# Patient Record
Sex: Female | Born: 1945
Health system: Southern US, Community
[De-identification: ages and names within clinical notes are randomized; demographics above are authoritative.]

## PROBLEM LIST (undated history)

## (undated) DIAGNOSIS — F419 Anxiety disorder, unspecified: Secondary | ICD-10-CM

## (undated) DIAGNOSIS — I10 Essential (primary) hypertension: Secondary | ICD-10-CM

## (undated) DIAGNOSIS — F329 Major depressive disorder, single episode, unspecified: Secondary | ICD-10-CM

## (undated) DIAGNOSIS — F32A Depression, unspecified: Secondary | ICD-10-CM

## (undated) HISTORY — PX: CHOLECYSTECTOMY: SHX55

## (undated) HISTORY — DX: Essential (primary) hypertension: I10

## (undated) HISTORY — PX: COLONOSCOPY: SHX174

## (undated) HISTORY — PX: OTHER SURGICAL HISTORY: SHX169

## (undated) HISTORY — PX: TONSILLECTOMY: SUR1361

---
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
## 1978-01-09 HISTORY — PX: BREAST RECONSTRUCTION: SHX9

## 1992-01-10 HISTORY — PX: BREAST RECONSTRUCTION: SHX9

## 2001-05-16 ENCOUNTER — Other Ambulatory Visit: Admission: RE | Admit: 2001-05-16 | Discharge: 2001-05-16 | Payer: Self-pay | Admitting: Internal Medicine

## 2002-05-21 ENCOUNTER — Other Ambulatory Visit: Admission: RE | Admit: 2002-05-21 | Discharge: 2002-05-21 | Payer: Self-pay | Admitting: Internal Medicine

## 2003-11-17 ENCOUNTER — Ambulatory Visit (HOSPITAL_COMMUNITY): Admission: RE | Admit: 2003-11-17 | Discharge: 2003-11-17 | Payer: Self-pay | Admitting: Gastroenterology

## 2004-12-27 ENCOUNTER — Other Ambulatory Visit: Admission: RE | Admit: 2004-12-27 | Discharge: 2004-12-27 | Payer: Self-pay | Admitting: Internal Medicine

## 2007-01-25 ENCOUNTER — Other Ambulatory Visit: Admission: RE | Admit: 2007-01-25 | Discharge: 2007-01-25 | Payer: Self-pay | Admitting: Family Medicine

## 2009-08-19 ENCOUNTER — Encounter: Admission: RE | Admit: 2009-08-19 | Discharge: 2009-08-19 | Payer: Self-pay | Admitting: Family Medicine

## 2010-03-10 ENCOUNTER — Other Ambulatory Visit: Payer: Self-pay | Admitting: Family Medicine

## 2010-03-10 ENCOUNTER — Other Ambulatory Visit (HOSPITAL_COMMUNITY)
Admission: RE | Admit: 2010-03-10 | Discharge: 2010-03-10 | Disposition: A | Discharge status: Home / Self Care | Payer: BC Managed Care – PPO | Source: Ambulatory Visit | Attending: Family Medicine | Admitting: Family Medicine

## 2010-03-10 DIAGNOSIS — Z0000 Encounter for general adult medical examination without abnormal findings: Secondary | ICD-10-CM | POA: Insufficient documentation

## 2010-05-27 NOTE — Op Note (Signed)
NAME:  TOMASITA, BEEVERS NO.:  [id number]   MEDICAL RECORD NO.:  [id number]          PATIENT TYPE:  AMB   LOCATION:  ENDO                         FACILITY:  [facility]   PHYSICIAN:  Danise Edge, M.D.   DATE OF BIRTH:  09/30/1945   DATE OF PROCEDURE:  11/17/2003  DATE OF DISCHARGE:                                 OPERATIVE REPORT   PROCEDURE:  Screening colonoscopy.   REFERRED BY:  Sharlet Salina, M.D.   INDICATIONS FOR PROCEDURE:  Ms. Ila Landowski is a 65 year old female born  1945/02/15.  Ms. Pavelko is scheduled to undergo her first screening  colonoscopy with polypectomy to prevent colon cancer.   ENDOSCOPIST:  Danise Edge, M.D.   PREMEDICATION:  Versed 7.5 mg, Demerol 70 mg.   DESCRIPTION OF PROCEDURE:  After obtaining informed consent, Ms. Fate was  placed in the left lateral decubitus position. I administered intravenous  Demerol and intravenous Versed to achieve conscious sedation for the  procedure. The patient's blood pressure, oxygen saturation and cardiac  rhythm were monitored throughout the procedure and documented in the medical  record.   Anal inspection and digital rectal exam were normal. The Olympus adjustable  pediatric colonoscope was introduced into the rectum and advanced to the  cecum. Colonic preparation for the exam today was excellent.   RECTUM:  Normal.   SIGMOID COLON AND DESCENDING COLON:  Scattered left colonic diverticula.   SPLENIC FLEXURE:  Normal.   TRANSVERSE COLON:  Normal.   HEPATIC FLEXURE:  Normal.   ASCENDING COLON:  Normal.   CECUM AND ILEOCECAL VALVE:  Normal.   ASSESSMENT:  Normal screening proctocolonoscopy to the cecum.  No endoscopic  evidence for the presence of colorectal neoplasia.      MJ/MEDQ  D:  11/17/2003  T:  11/17/2003  Job:  [id number]   cc:   Sharlet Salina, M.D.  [address]  [place]  [place] [address]  Fax: [phone]

## 2010-07-15 ENCOUNTER — Other Ambulatory Visit: Payer: Self-pay | Admitting: Family Medicine

## 2010-07-15 DIAGNOSIS — R1013 Epigastric pain: Secondary | ICD-10-CM

## 2010-07-18 ENCOUNTER — Ambulatory Visit
Admission: RE | Admit: 2010-07-18 | Discharge: 2010-07-18 | Disposition: A | Discharge status: Home / Self Care | Payer: BC Managed Care – PPO | Source: Ambulatory Visit | Attending: Family Medicine | Admitting: Family Medicine

## 2010-07-18 DIAGNOSIS — R1013 Epigastric pain: Secondary | ICD-10-CM

## 2010-07-20 ENCOUNTER — Encounter (INDEPENDENT_AMBULATORY_CARE_PROVIDER_SITE_OTHER): Payer: Self-pay | Admitting: Surgery

## 2010-07-20 ENCOUNTER — Ambulatory Visit (INDEPENDENT_AMBULATORY_CARE_PROVIDER_SITE_OTHER): Payer: BC Managed Care – PPO | Admitting: Surgery

## 2010-07-20 VITALS — BP 124/82 | HR 70 | Temp 97.2°F | Resp 16 | Ht 60.25 in | Wt 125.4 lb

## 2010-07-20 DIAGNOSIS — K8010 Calculus of gallbladder with chronic cholecystitis without obstruction: Secondary | ICD-10-CM

## 2010-07-20 NOTE — Progress Notes (Signed)
Subjective:     Patient ID: Melody [name], female   DOB: 1945/07/12, 65 y.o.   MRN: [id number]    BP 124/82  Pulse 70  Temp(Src) 97.2 F (36.2 C) (Temporal)  Resp 16  Ht 5' 0.25" (1.53 m)  Wt 125 lb 6.4 oz (56.881 kg)  BMI 24.29 kg/m2    HPI The patient presents today at the request of Dr. Cliffton Asters. She has a one-week history of epigastric abdominal pain. Pain is mild to moderate in intensity and now is more severe last week. There is no radiation. Now he feels like a dull ache. He is better today than it was last week. There is no associated nausea or vomiting. Ultrasound shows gallstones. Past Medical History  Diagnosis Date  . Hypertension    Past Surgical History  Procedure Date  . Cesarean section   . Tonsilectomy, adenoidectomy, bilateral myringotomy and tubes 1955  . Breast reconstruction 1980    breast implant placement  . Breast reconstruction 1994    breast implant removal   Allergies  Allergen Reactions  . Ambien    History   Social History  . Marital Status: Divorced    Spouse Name: N/A    Number of Children: N/A  . Years of Education: N/A   Occupational History  . Not on file.   Social History Main Topics  . Smoking status: Never Smoker   . Smokeless tobacco: Never Used  . Alcohol Use: No  . Drug Use: No  . Sexually Active:    Other Topics Concern  . Not on file   Social History Narrative  . No narrative on file     Review of Systems  Constitutional: Negative.   HENT: Negative.   Eyes: Negative.   Respiratory: Negative.   Cardiovascular: Negative.   Gastrointestinal: Positive for abdominal pain.  Genitourinary: Negative.   Musculoskeletal: Negative.   Skin: Negative.   Neurological: Negative.   Hematological: Negative.   Psychiatric/Behavioral: Negative.        Objective:   Physical Exam  Constitutional: She is oriented to person, place, and time. She appears well-developed and well-nourished.  HENT:  Head: Normocephalic and  atraumatic.  Eyes: Conjunctivae and EOM are normal. Pupils are equal, round, and reactive to light.  Neck: Normal range of motion. Neck supple.  Cardiovascular: Normal rate, regular rhythm and normal heart sounds.  Exam reveals no gallop.   No murmur heard. Pulmonary/Chest: Effort normal and breath sounds normal.  Abdominal: Soft. Bowel sounds are normal. There is tenderness.       Mild RUQ tenderness  Musculoskeletal: Normal range of motion.  Neurological: She is alert and oriented to person, place, and time. She has normal reflexes.  Skin: Skin is warm and dry.  Psychiatric: She has a normal mood and affect. Judgment and thought content normal.       Assessment:     Symptomatic cholelithiasis with probable mild acute cholecystitis resolving    Plan:     I discussed treatment options with the patient today. I discussed medical and surgical options as well today. I feel laparoscopic cholecystectomy with cholangiogram would be the best treatment option for her. I discussed the procedure as well as long-term expectations and complications.The procedure has been discussed with the patient. Operative and non operative treatments have been discussed. Risks of surgery include bleeding, iInfection,  Common bile duct injury,  Injury to the stomach,liver, colon,small intestine, abdominal wall,  Diaphragm,  Major blood vessels,  And the need  for an open procedure.  Other risks include worsening of medical problems,  DVT and pulmonary embolism, and cardiovascular events.   Medical options have also been discussed. The patient has been informed of long term expectations of surgery and non surgical options,  The patient agrees to proceed.

## 2010-07-20 NOTE — Patient Instructions (Signed)
Avoid fatty foods.  You will be scheduled for surgery.

## 2010-08-17 ENCOUNTER — Other Ambulatory Visit (INDEPENDENT_AMBULATORY_CARE_PROVIDER_SITE_OTHER): Payer: Self-pay | Admitting: Surgery

## 2010-08-17 ENCOUNTER — Ambulatory Visit (HOSPITAL_COMMUNITY)
Admission: RE | Admit: 2010-08-17 | Discharge: 2010-08-17 | Disposition: A | Discharge status: Home / Self Care | Payer: BC Managed Care – PPO | Source: Ambulatory Visit | Attending: Surgery | Admitting: Surgery

## 2010-08-17 ENCOUNTER — Encounter (HOSPITAL_COMMUNITY): Payer: BC Managed Care – PPO

## 2010-08-17 DIAGNOSIS — Z01818 Encounter for other preprocedural examination: Secondary | ICD-10-CM | POA: Insufficient documentation

## 2010-08-17 DIAGNOSIS — Z01812 Encounter for preprocedural laboratory examination: Secondary | ICD-10-CM | POA: Insufficient documentation

## 2010-08-17 DIAGNOSIS — Z01810 Encounter for preprocedural cardiovascular examination: Secondary | ICD-10-CM | POA: Insufficient documentation

## 2010-08-17 LAB — COMPREHENSIVE METABOLIC PANEL WITH GFR
ALT: 16 U/L (ref 0–35)
AST: 21 U/L (ref 0–37)
Albumin: 3.8 g/dL (ref 3.5–5.2)
Alkaline Phosphatase: 62 U/L (ref 39–117)
BUN: 21 mg/dL (ref 6–23)
CO2: 32 meq/L (ref 19–32)
Calcium: 10 mg/dL (ref 8.4–10.5)
Chloride: 100 meq/L (ref 96–112)
Creatinine, Ser: 0.65 mg/dL (ref 0.50–1.10)
GFR calc Af Amer: >60 mL/min (ref >60)
GFR calc non Af Amer: >60 mL/min (ref >60)
Glucose, Bld: 93 mg/dL (ref 70–99)
Potassium: 4.7 meq/L (ref 3.5–5.1)
Sodium: 137 meq/L (ref 135–145)
Total Bilirubin: 0.3 mg/dL (ref 0.3–1.2)
Total Protein: 7.6 g/dL (ref 6.0–8.3)

## 2010-08-17 LAB — DIFFERENTIAL
Basophils Absolute: 0.1 10*3/uL (ref 0.0–0.1)
Basophils Absolute: 0.1 10*3/uL (ref 0.0–0.1)
Basophils Relative: 1 % (ref 0–1)
Basophils Relative: 1 % (ref 0–1)
Eosinophils Absolute: 0.2 10*3/uL (ref 0.0–0.7)
Eosinophils Absolute: 0.2 10*3/uL (ref 0.0–0.7)
Eosinophils Relative: 4 % (ref 0–5)
Eosinophils Relative: 4 % (ref 0–5)
Lymphocytes Relative: 41 % (ref 12–46)
Lymphocytes Relative: 41 % (ref 12–46)
Lymphs Abs: 2.6 10*3/uL (ref 0.7–4.0)
Lymphs Abs: 2.6 10*3/uL (ref 0.7–4.0)
Monocytes Absolute: 0.6 10*3/uL (ref 0.1–1.0)
Monocytes Absolute: 0.6 10*3/uL (ref 0.1–1.0)
Monocytes Relative: 9 % (ref 3–12)
Monocytes Relative: 9 % (ref 3–12)
Neutro Abs: 2.8 10*3/uL (ref 1.7–7.7)
Neutro Abs: 2.8 10*3/uL (ref 1.7–7.7)
Neutrophils Relative %: 45 % (ref 43–77)
Neutrophils Relative %: 45 % (ref 43–77)

## 2010-08-17 LAB — SURGICAL PCR SCREEN
MRSA, PCR: NEGATIVE
Staphylococcus aureus: NEGATIVE

## 2010-08-17 LAB — CBC
HCT: 40.2 % (ref 36.0–46.0)
Hemoglobin: 13.4 g/dL (ref 12.0–15.0)
MCH: 30 pg (ref 26.0–34.0)
MCHC: 33.3 g/dL (ref 30.0–36.0)
MCV: 89.9 fL (ref 78.0–100.0)
Platelets: 167 10*3/uL (ref 150–400)
RBC: 4.47 MIL/uL (ref 3.87–5.11)
RDW: 13.2 % (ref 11.5–15.5)
WBC: 6.3 10*3/uL (ref 4.0–10.5)

## 2010-08-29 ENCOUNTER — Other Ambulatory Visit (INDEPENDENT_AMBULATORY_CARE_PROVIDER_SITE_OTHER): Payer: Self-pay | Admitting: Surgery

## 2010-08-29 ENCOUNTER — Ambulatory Visit (HOSPITAL_COMMUNITY): Payer: BC Managed Care – PPO

## 2010-08-29 ENCOUNTER — Ambulatory Visit (HOSPITAL_COMMUNITY)
Admission: RE | Admit: 2010-08-29 | Discharge: 2010-08-29 | Disposition: A | Discharge status: Home / Self Care | Payer: BC Managed Care – PPO | Source: Ambulatory Visit | Attending: Surgery | Admitting: Surgery

## 2010-08-29 DIAGNOSIS — Z01818 Encounter for other preprocedural examination: Secondary | ICD-10-CM | POA: Insufficient documentation

## 2010-08-29 DIAGNOSIS — Z01810 Encounter for preprocedural cardiovascular examination: Secondary | ICD-10-CM | POA: Insufficient documentation

## 2010-08-29 DIAGNOSIS — K8010 Calculus of gallbladder with chronic cholecystitis without obstruction: Secondary | ICD-10-CM

## 2010-08-29 DIAGNOSIS — Z01812 Encounter for preprocedural laboratory examination: Secondary | ICD-10-CM | POA: Insufficient documentation

## 2010-09-05 NOTE — Op Note (Signed)
[name], [name] NO.:  [id number]  MEDICAL RECORD NO.:  [id number]  LOCATION:  DAYL                         FACILITY:  [facility]  PHYSICIAN:  Melody [name], M.D.DATE OF BIRTH:  03/16/1945  DATE OF PROCEDURE:  08/29/2010 DATE OF DISCHARGE:  08/29/2010                              OPERATIVE REPORT   PREOPERATIVE DIAGNOSIS:  Symptomatic cholelithiasis with chronic cholecystitis.  POSTOPERATIVE DIAGNOSIS:  Symptomatic cholelithiasis with chronic cholecystitis.  PROCEDURE PERFORMED:  Laparoscopic cholecystectomy with intraoperative cholangiogram.  SURGEON:  Melody Mckellips A. Burris Matherne, MD  ANESTHESIA:  General endotracheal anesthesia, with 0.25% Sensorcaine local with epinephrine.  ESTIMATED BLOOD LOSS:  Minimal.  SPECIMEN:  Gallbladder, gallstones to pathology.  DRAINS:  None.  INDICATIONS FOR PROCEDURE:  The patient is a 65 year old female with symptomatic cholelithiasis.  She has had multiple attacks and probably some acute on chronic cholecystitis.  We discussed options of medical and surgical treatment of this condition.  We discussed the pathophysiology of the condition.  Risk of operative and nonoperative management were discussed.  The benefits of surgery and risks of surgery were discussed.  Risk of bleeding, infection, bile duct injury, injury to neighboring structures, injury to the colon, small-bowel, liver, stomach, duodenum, and abdominal wall and diaphragm were discussed. Other risks were discussed, which are outlined in my history and physical.  She understood the above and wished to proceed with surgical management.  DESCRIPTION OF PROCEDURE:  The patient was brought to the operating room.  After lying prone on the operating table, general anesthesia was initiated and time-out was done.  After sterile prep and drape of the abdomen, 1 cm infraumbilical incision was made.  Dissection was carried down the fascia and the fascia was opened  without any difficulty.  A pursestring suture of 0 Vicryl was placed and a 12-mm Hasson cannula was placed under direct vision.  Pneumoperitoneum was created to 15 mmHg of CO2 and laparoscope was placed.  On inspection, there was no evidence of injury to the bowel upon insertion at this port site.  An 11-mm subxiphoid port was placed and two 5-mm ports were placed in the right upper quadrant.  We then identified the gallbladder.  It was grabbed by its dome and retracted to the patient's right shoulder.  She had some chronic changes of cholecystitis but no acute on chronic today.  Second grasper was used to grab the infundibulum, pulled to the patient's right lower quadrant.  This opened his triangle of Calot.  We were able then to dissect out the cystic duct.  The critical view was achieved.  I then placed clips on the cystic duct, on the gallbladder side of this.  Small incision was made through a separate stab incision and Cook cholangiogram catheter was placed into the cystic duct.  Intraoperative cholangiogram was then performed using one-half strength Hypaque dye. There was free flow of contrast in a tortuous, long cystic duct stump, into the common bile duct and duodenum.  There was free flow of contrast into the common hepatic duct and right and left hepatic ducts.  No signs of stricture, extravasation or stones.  We then removed the catheter. We placed three clips  across the cystic duct stump and divided it.  The cystic artery was identified, dissected out, and controlled between clips and divided.  Cautery was used to dissect the gallbladder from the gallbladder fossa without difficulty.  This was then placed in an EndoCatch bag.  The gallbladder bed was examined, found be hemostatic with no evidence of bleeding or bile leakage.  We then extracted the gallbladder with the EndoCatch bag through the umbilicus and passed it off the field.  This fascial wound was closed with 0 Vicryl  under direct vision.  We then reinspected the gallbladder bed, saw no signs of bleeding or bile leakage and suctioned out any excess irrigation.  Four quadrant laparoscopy was performed which showed no evidence of injury to the bowel or any problem with the closure of the umbilicus.  We then removed our ports allowing the CO2 to escape.  I placed a single stitch in the fascia at the subxiphoid trocar site. 4-0 Monocryl was used to close the skin in a subcuticular fashion.  Dermabond was applied.  All final counts of sponge, needles and instruments were found to be correct at this portion of the case.  The patient was awoke, extubated, taken to recovery in satisfactory condition.     Melody [name], M.D.     TAC/MEDQ  D:  08/29/2010  T:  08/29/2010  Job:  [id number]  cc:   Melody [name], M.D. Fax: [phone]  Electronically Signed by Melody [name] M.D. on 09/05/2010 10:39:27 AM

## 2010-09-22 ENCOUNTER — Encounter (INDEPENDENT_AMBULATORY_CARE_PROVIDER_SITE_OTHER): Payer: BC Managed Care – PPO | Admitting: Surgery

## 2010-10-13 ENCOUNTER — Emergency Department (HOSPITAL_COMMUNITY): Payer: BC Managed Care – PPO

## 2010-10-13 ENCOUNTER — Emergency Department (HOSPITAL_COMMUNITY)
Admission: EM | Admit: 2010-10-13 | Discharge: 2010-10-13 | Disposition: A | Discharge status: Nursing Home (Includes ICF) | Payer: BC Managed Care – PPO | Source: Home / Self Care | Attending: Emergency Medicine | Admitting: Emergency Medicine

## 2010-10-13 ENCOUNTER — Observation Stay (HOSPITAL_COMMUNITY)
Admission: EM | Admit: 2010-10-13 | Discharge: 2010-10-15 | DRG: 142 | Disposition: A | Discharge status: Home / Self Care | Payer: BC Managed Care – PPO | Attending: Internal Medicine | Admitting: Internal Medicine

## 2010-10-13 ENCOUNTER — Encounter (HOSPITAL_COMMUNITY): Payer: Self-pay

## 2010-10-13 DIAGNOSIS — Z23 Encounter for immunization: Secondary | ICD-10-CM | POA: Insufficient documentation

## 2010-10-13 DIAGNOSIS — F3289 Other specified depressive episodes: Secondary | ICD-10-CM | POA: Insufficient documentation

## 2010-10-13 DIAGNOSIS — I10 Essential (primary) hypertension: Secondary | ICD-10-CM | POA: Insufficient documentation

## 2010-10-13 DIAGNOSIS — W19XXXA Unspecified fall, initial encounter: Secondary | ICD-10-CM | POA: Insufficient documentation

## 2010-10-13 DIAGNOSIS — R55 Syncope and collapse: Principal | ICD-10-CM | POA: Insufficient documentation

## 2010-10-13 DIAGNOSIS — Y92009 Unspecified place in unspecified non-institutional (private) residence as the place of occurrence of the external cause: Secondary | ICD-10-CM | POA: Insufficient documentation

## 2010-10-13 DIAGNOSIS — R112 Nausea with vomiting, unspecified: Secondary | ICD-10-CM | POA: Insufficient documentation

## 2010-10-13 DIAGNOSIS — M47812 Spondylosis without myelopathy or radiculopathy, cervical region: Secondary | ICD-10-CM | POA: Insufficient documentation

## 2010-10-13 DIAGNOSIS — S022XXA Fracture of nasal bones, initial encounter for closed fracture: Secondary | ICD-10-CM | POA: Insufficient documentation

## 2010-10-13 DIAGNOSIS — F329 Major depressive disorder, single episode, unspecified: Secondary | ICD-10-CM | POA: Insufficient documentation

## 2010-10-13 DIAGNOSIS — E785 Hyperlipidemia, unspecified: Secondary | ICD-10-CM | POA: Insufficient documentation

## 2010-10-13 LAB — DIFFERENTIAL
Basophils Absolute: 0 10*3/uL (ref 0.0–0.1)
Basophils Relative: 1 % (ref 0–1)
Eosinophils Absolute: 0.2 10*3/uL (ref 0.0–0.7)
Eosinophils Relative: 3 % (ref 0–5)
Lymphocytes Relative: 31 % (ref 12–46)
Lymphs Abs: 2.5 10*3/uL (ref 0.7–4.0)
Monocytes Absolute: 0.7 10*3/uL (ref 0.1–1.0)
Monocytes Relative: 9 % (ref 3–12)
Neutro Abs: 4.5 10*3/uL (ref 1.7–7.7)
Neutrophils Relative %: 57 % (ref 43–77)

## 2010-10-13 LAB — POCT I-STAT, CHEM 8
BUN: 22 mg/dL (ref 6–23)
Calcium, Ion: 1.32 mmol/L (ref 1.12–1.32)
Chloride: 102 mEq/L (ref 96–112)
Creatinine, Ser: 1.1 mg/dL (ref 0.50–1.10)
Glucose, Bld: 110 mg/dL — ABNORMAL HIGH (ref 70–99)
HCT: 39 % (ref 36.0–46.0)
Hemoglobin: 13.3 g/dL (ref 12.0–15.0)
Potassium: 3.5 mEq/L (ref 3.5–5.1)
Sodium: 138 mEq/L (ref 135–145)
TCO2: 28 mmol/L (ref 0–100)

## 2010-10-13 LAB — CBC
HCT: 37.3 % (ref 36.0–46.0)
Hemoglobin: 12.2 g/dL (ref 12.0–15.0)
MCH: 29.4 pg (ref 26.0–34.0)
MCHC: 32.7 g/dL (ref 30.0–36.0)
MCV: 89.9 fL (ref 78.0–100.0)
Platelets: 198 10*3/uL (ref 150–400)
RBC: 4.15 MIL/uL (ref 3.87–5.11)
RDW: 13.5 % (ref 11.5–15.5)
WBC: 7.9 10*3/uL (ref 4.0–10.5)

## 2010-10-13 LAB — CARDIAC PANEL(CRET KIN+CKTOT+MB+TROPI)
CK, MB: 2.2 ng/mL (ref 0.3–4.0)
CK, MB: 2.1 ng/mL (ref 0.3–4.0)
Relative Index: INVALID (ref 0.0–2.5)
Relative Index: INVALID (ref 0.0–2.5)
Total CK: 65 U/L (ref 7–177)
Total CK: 64 U/L (ref 7–177)
Troponin I: <0.3 ng/mL (ref <0.30)
Troponin I: <0.3 ng/mL (ref <0.30)

## 2010-10-13 LAB — COMPREHENSIVE METABOLIC PANEL
ALT: 19 U/L (ref 0–35)
AST: 23 U/L (ref 0–37)
Albumin: 3.5 g/dL (ref 3.5–5.2)
Alkaline Phosphatase: 61 U/L (ref 39–117)
BUN: 17 mg/dL (ref 6–23)
CO2: 29 mEq/L (ref 19–32)
Calcium: 9.5 mg/dL (ref 8.4–10.5)
Chloride: 105 mEq/L (ref 96–112)
Creatinine, Ser: 0.61 mg/dL (ref 0.50–1.10)
GFR calc Af Amer: >90 mL/min (ref >90)
GFR calc non Af Amer: >90 mL/min (ref >90)
Glucose, Bld: 98 mg/dL (ref 70–99)
Potassium: 3.6 mEq/L (ref 3.5–5.1)
Sodium: 139 mEq/L (ref 135–145)
Total Bilirubin: 0.2 mg/dL — ABNORMAL LOW (ref 0.3–1.2)
Total Protein: 6.8 g/dL (ref 6.0–8.3)

## 2010-10-13 LAB — URINALYSIS, ROUTINE W REFLEX MICROSCOPIC
Bilirubin Urine: NEGATIVE
Glucose, UA: NEGATIVE mg/dL
Hgb urine dipstick: NEGATIVE
Ketones, ur: 15 mg/dL — AB
Nitrite: NEGATIVE
Protein, ur: NEGATIVE mg/dL
Specific Gravity, Urine: 1.02 (ref 1.005–1.030)
Urobilinogen, UA: 0.2 mg/dL (ref 0.0–1.0)
pH: 7.5 (ref 5.0–8.0)

## 2010-10-13 LAB — URINE MICROSCOPIC-ADD ON

## 2010-10-13 LAB — POCT I-STAT TROPONIN I: Troponin i, poc: 0 ng/mL (ref 0.00–0.08)

## 2010-10-13 LAB — TSH: TSH: 0.45 u[IU]/mL (ref 0.350–4.500)

## 2010-10-14 LAB — DRUGS OF ABUSE SCREEN W/O ALC, ROUTINE URINE
Amphetamine Screen, Ur: NEGATIVE
Barbiturate Quant, Ur: NEGATIVE
Benzodiazepines.: NEGATIVE
Cocaine Metabolites: NEGATIVE
Creatinine,U: 130.7 mg/dL
Marijuana Metabolite: NEGATIVE
Methadone: NEGATIVE
Opiate Screen, Urine: NEGATIVE
Phencyclidine (PCP): NEGATIVE
Propoxyphene: NEGATIVE

## 2010-10-14 LAB — URINE CULTURE
Colony Count: NO GROWTH
Culture  Setup Time: 201210041033
Culture: NO GROWTH

## 2010-10-14 LAB — CARDIAC PANEL(CRET KIN+CKTOT+MB+TROPI)
CK, MB: 2.3 ng/mL (ref 0.3–4.0)
Relative Index: INVALID (ref 0.0–2.5)
Total CK: 64 U/L (ref 7–177)
Troponin I: <0.3 ng/mL (ref <0.30)

## 2010-10-14 LAB — BASIC METABOLIC PANEL
BUN: 16 mg/dL (ref 6–23)
CO2: 28 mEq/L (ref 19–32)
Calcium: 8.9 mg/dL (ref 8.4–10.5)
Chloride: 106 mEq/L (ref 96–112)
Creatinine, Ser: 0.6 mg/dL (ref 0.50–1.10)
GFR calc Af Amer: >90 mL/min (ref >90)
GFR calc non Af Amer: >90 mL/min (ref >90)
Glucose, Bld: 105 mg/dL — ABNORMAL HIGH (ref 70–99)
Potassium: 3.4 mEq/L — ABNORMAL LOW (ref 3.5–5.1)
Sodium: 139 mEq/L (ref 135–145)

## 2010-10-14 LAB — CBC
HCT: 33.4 % — ABNORMAL LOW (ref 36.0–46.0)
Hemoglobin: 11.3 g/dL — ABNORMAL LOW (ref 12.0–15.0)
MCH: 29.9 pg (ref 26.0–34.0)
MCHC: 33.8 g/dL (ref 30.0–36.0)
MCV: 88.4 fL (ref 78.0–100.0)
Platelets: 178 10*3/uL (ref 150–400)
RBC: 3.78 MIL/uL — ABNORMAL LOW (ref 3.87–5.11)
RDW: 13.6 % (ref 11.5–15.5)
WBC: 8.3 10*3/uL (ref 4.0–10.5)

## 2010-10-14 LAB — LIPID PANEL
Cholesterol: 169 mg/dL (ref 0–200)
HDL: 90 mg/dL (ref >39)
LDL Cholesterol: 65 mg/dL (ref 0–99)
Total CHOL/HDL Ratio: 1.9 RATIO
Triglycerides: 71 mg/dL (ref <150)
VLDL: 14 mg/dL (ref 0–40)

## 2010-10-14 LAB — HEMOGLOBIN A1C
Hgb A1c MFr Bld: 5.9 % — ABNORMAL HIGH (ref <5.7)
Mean Plasma Glucose: 123 mg/dL — ABNORMAL HIGH (ref <117)

## 2010-10-26 NOTE — Consult Note (Signed)
Melody [name], Melody [name] NO.:  [id number]  MEDICAL RECORD NO.:  [id number]  LOCATION:  4730                         FACILITY:  [facility]  PHYSICIAN:  Hewitt Shorts, M.D.DATE OF BIRTH:  [birth year]  DATE OF CONSULTATION:  10/13/2010 DATE OF DISCHARGE:                                CONSULTATION   HISTORY OF PRESENT ILLNESS:  The patient is a 65 year old right-handed white female who suffered a syncopal episode earlier this morning at home.  She was brought by her family to the [facility] Emergency Room and evaluated by Dr. Cy Blamer, the emergency room physician.  Her workup included CT scan of the head that was unremarkable, a CT scan of the cervical spine which showed a 1-2 mm anterior subluxation of C4 on C5 with particular left C4-C5 facet arthropathy as well as C1-C2 arthropathy involving the dens and anterior ring of the C1.  No evidence of fracture was seen.  Neurosurgical consultation was requested by Dr. Nicanor Alcon for further recommendations.  Notably, the patient was also found to have sustained other injuries including a nasal fracture and additional workup included an EKG, which the reading from the machine described as sinus rhythm.  Historically, the patient notes history of chronic left-sided neck pain for which she sees a chiropractor monthly because of "tension in her neck."  She describes that the neck typically hurts on the left side. She works at a computer all day, which tends to aggravate it.  As regards the events earlier this morning, her dog needed to go out in the middle of the night, she went outside and apparently passed out. She describes that she felt like she was going to pass out and believes that she may have struck her nose on the railing of the deck and then fell backward striking the back of her head.  She says that initially she felt like she could not move her extremities.  Subsequently, she was able to  get into the house, she woke her son and her family then brought her to the [facility] for evaluation.  She does have a significant history of depression followed and managed by Dr. Meredith Staggers, MD, her psychiatrist.  Her medications have been under adjustment and in fact, she started a new medication yesterday called Abilify.  She also takes Cymbalta and trazodone and did take her trazodone last night, but also took Benadryl last night.  PAST MEDICAL HISTORY:  Notable for history of depression, hypertension, and hypercholesterolemia.  She denies any history of myocardial infarction, cancer, stroke, peptic ulcer disease, diabetes, or lung disease.  PREVIOUS SURGERY:  Includes breast augmentation, removal of breast augmentation, cesarean section, and recent cholecystectomy.  ALLERGIES:  She denies allergy to medications.  CURRENT MEDICATIONS:  Include benazepril/hydrochlorothiazide, Cymbalta, Lipitor, trazodone, Abilify, multivitamin, calcium, vitamin D3, fish oil, and Benadryl.  The Cymbalta, trazodone, and Abilify were all prescribed by Dr. Jennelle Human.  Her primary is Dr. Laurann Montana at [facility]  FAMILY HISTORY:  Mother is aged 63 and living.  Father died at about age 30.  He had heart disease and emphysema.  He had been a  heavy smoker. There is a family history as well of  hyperlipidemia and hypertension.  SOCIAL HISTORY:  The patient is divorced.  She does not smoke, drink alcoholic beverage or use illicit drugs.  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  REVIEW OF SYSTEMS:  Notable for those described in history of present illness, but is otherwise unremarkable.  PHYSICAL EXAMINATION:  GENERAL:  The patient is a well-developed, well- nourished white female, in no acute distress. VITAL SIGNS:  Temperature is 97.4, pulse 68, blood pressure 141/77. NECK:  Nontender to palpation over the  cervical spinous processes. Mobility of the neck was not tested due to being immobilized in the [place] cervical collar. MENTAL STATUS:  Shows the patient is awake and alert.  She is oriented to her name, [facility] and October 2012.  Speech is fluent.  She has good comprehension. NEUROLOGIC:  Cranial nerves show pupils are equal round and reactive to light.  Extraocular movements intact.  Facial sensation is intact. Facial movement is symmetrical.  Hearing is present bilaterally. Palatal movement is symmetrical.  Shoulder shrug is symmetrical.  Tongue is midline.  Motor examination shows 5/5 strength in the upper and lower extremities including the deltoid, biceps, triceps, intrinsics, grip, iliopsoas, quadriceps, dorsiflexors, extensor hallucis longus, plantar flexor bilaterally.  Sensation is intact to pinprick through the digits of the upper extremities and through the distal lower extremities including the legs and feet.  Reflexes are trace to 1 in the biceps, brachioradialis, and triceps, 1 to 2 in the quadriceps.  __________ is symmetrical bilaterally.  Toes are downgoing bilaterally.  Gait and station not tested due to the nature of her condition.  Additional workup included an MRI scan of the cervical spine done without contrast, again it shows evidence of the C1-C2 arthropathy as well as a left C4-C5 facet arthropathy as well as a 2 mm anterolisthesis of C4 on C5 as well as showing left C4-C5 neural foraminal stenosis, particularly contributed to by the hypertrophic left C4-C5 facet arthropathy.  There is no evidence of acute bony or ligamentous injury per the interpreting neuroradiologist, Dr. Augusto Gamble.  IMPRESSION: 1. Syncope. 2. Nasal fracture. 3. Cervical spondylosis, degenerative spondylolisthesis secondary to     facet arthropathy, which is asymptomatic at this time, but which     does cause her left-sided neck discomfort at times.  There is  no     evidence of acute injury.  RECOMMENDATIONS:  Discussed case with Dr. Nicanor Alcon and also I have had an opportunity to speak with the patient and her son, who is present.  Dr. Nicanor Alcon has made arrangements for transfer of the patient to [facility] Emergency Room to facilitate trauma surgery consultation, admission and workup. 1. Regarding her syncope, workup will be deferred to the emergency     room physician and trauma surgery staff. 2. Regarding her nasal fracture, workup, treatment and care will be     deferred to the emergency room physician and the trauma surgical     staff. 3. Regarding her arthritic changes in the cervical spine, we have     requested an [employer] cervical collar and a cervical spine x-     rays shows flexion/extension views.  If no significant dynamic     instability is found on the     flexion/extension views, we will be able to change the patient over     either to  a soft cervical collar for comfort or no collar at all     with subsequent follow up with me in my office in a few weeks.  All     of this was explained to the patient and her son and their     questions regarding this were answered for them.     Hewitt Shorts, M.D.     RWN/MEDQ  D:  10/13/2010  T:  10/13/2010  Job:  [id number]  Electronically Signed by Shirlean Kelly M.D. on 10/26/2010 01:53:16 PM

## 2012-01-22 DIAGNOSIS — E785 Hyperlipidemia, unspecified: Secondary | ICD-10-CM | POA: Diagnosis not present

## 2012-02-16 DIAGNOSIS — F321 Major depressive disorder, single episode, moderate: Secondary | ICD-10-CM | POA: Diagnosis not present

## 2012-03-22 DIAGNOSIS — M949 Disorder of cartilage, unspecified: Secondary | ICD-10-CM | POA: Diagnosis not present

## 2012-03-22 DIAGNOSIS — M899 Disorder of bone, unspecified: Secondary | ICD-10-CM | POA: Diagnosis not present

## 2012-03-22 DIAGNOSIS — Z1231 Encounter for screening mammogram for malignant neoplasm of breast: Secondary | ICD-10-CM | POA: Diagnosis not present

## 2012-04-01 DIAGNOSIS — E785 Hyperlipidemia, unspecified: Secondary | ICD-10-CM | POA: Diagnosis not present

## 2012-04-01 DIAGNOSIS — Z23 Encounter for immunization: Secondary | ICD-10-CM | POA: Diagnosis not present

## 2012-04-01 DIAGNOSIS — R42 Dizziness and giddiness: Secondary | ICD-10-CM | POA: Diagnosis not present

## 2012-04-01 DIAGNOSIS — I10 Essential (primary) hypertension: Secondary | ICD-10-CM | POA: Diagnosis not present

## 2012-04-19 DIAGNOSIS — I789 Disease of capillaries, unspecified: Secondary | ICD-10-CM | POA: Diagnosis not present

## 2012-04-19 DIAGNOSIS — D1801 Hemangioma of skin and subcutaneous tissue: Secondary | ICD-10-CM | POA: Diagnosis not present

## 2012-04-19 DIAGNOSIS — D239 Other benign neoplasm of skin, unspecified: Secondary | ICD-10-CM | POA: Diagnosis not present

## 2012-04-19 DIAGNOSIS — L821 Other seborrheic keratosis: Secondary | ICD-10-CM | POA: Diagnosis not present

## 2012-04-19 DIAGNOSIS — L819 Disorder of pigmentation, unspecified: Secondary | ICD-10-CM | POA: Diagnosis not present

## 2012-06-07 DIAGNOSIS — F321 Major depressive disorder, single episode, moderate: Secondary | ICD-10-CM | POA: Diagnosis not present

## 2012-10-04 ENCOUNTER — Other Ambulatory Visit: Payer: Self-pay | Admitting: Family Medicine

## 2012-10-04 ENCOUNTER — Other Ambulatory Visit (HOSPITAL_COMMUNITY)
Admission: RE | Admit: 2012-10-04 | Discharge: 2012-10-04 | Disposition: A | Discharge status: Home / Self Care | Payer: Medicare Other | Source: Ambulatory Visit | Attending: Family Medicine | Admitting: Family Medicine

## 2012-10-04 DIAGNOSIS — E785 Hyperlipidemia, unspecified: Secondary | ICD-10-CM | POA: Diagnosis not present

## 2012-10-04 DIAGNOSIS — E559 Vitamin D deficiency, unspecified: Secondary | ICD-10-CM | POA: Diagnosis not present

## 2012-10-04 DIAGNOSIS — Z124 Encounter for screening for malignant neoplasm of cervix: Secondary | ICD-10-CM | POA: Insufficient documentation

## 2012-10-04 DIAGNOSIS — I10 Essential (primary) hypertension: Secondary | ICD-10-CM | POA: Diagnosis not present

## 2012-10-04 DIAGNOSIS — Z23 Encounter for immunization: Secondary | ICD-10-CM | POA: Diagnosis not present

## 2012-10-04 DIAGNOSIS — Z0000 Encounter for general adult medical examination without abnormal findings: Secondary | ICD-10-CM | POA: Diagnosis not present

## 2012-10-04 DIAGNOSIS — N899 Noninflammatory disorder of vagina, unspecified: Secondary | ICD-10-CM | POA: Diagnosis not present

## 2012-10-29 DIAGNOSIS — N760 Acute vaginitis: Secondary | ICD-10-CM | POA: Diagnosis not present

## 2012-11-15 DIAGNOSIS — E785 Hyperlipidemia, unspecified: Secondary | ICD-10-CM | POA: Diagnosis not present

## 2012-11-15 DIAGNOSIS — Z79899 Other long term (current) drug therapy: Secondary | ICD-10-CM | POA: Diagnosis not present

## 2012-11-19 DIAGNOSIS — N760 Acute vaginitis: Secondary | ICD-10-CM | POA: Diagnosis not present

## 2012-11-26 DIAGNOSIS — F321 Major depressive disorder, single episode, moderate: Secondary | ICD-10-CM | POA: Diagnosis not present

## 2012-12-27 DIAGNOSIS — Z79899 Other long term (current) drug therapy: Secondary | ICD-10-CM | POA: Diagnosis not present

## 2012-12-27 DIAGNOSIS — E785 Hyperlipidemia, unspecified: Secondary | ICD-10-CM | POA: Diagnosis not present

## 2013-03-28 DIAGNOSIS — Z1231 Encounter for screening mammogram for malignant neoplasm of breast: Secondary | ICD-10-CM | POA: Diagnosis not present

## 2013-04-04 DIAGNOSIS — Z209 Contact with and (suspected) exposure to unspecified communicable disease: Secondary | ICD-10-CM | POA: Diagnosis not present

## 2013-04-04 DIAGNOSIS — I10 Essential (primary) hypertension: Secondary | ICD-10-CM | POA: Diagnosis not present

## 2013-04-04 DIAGNOSIS — E785 Hyperlipidemia, unspecified: Secondary | ICD-10-CM | POA: Diagnosis not present

## 2013-04-04 DIAGNOSIS — Z23 Encounter for immunization: Secondary | ICD-10-CM | POA: Diagnosis not present

## 2013-05-16 DIAGNOSIS — F321 Major depressive disorder, single episode, moderate: Secondary | ICD-10-CM | POA: Diagnosis not present

## 2013-07-30 IMAGING — RF DG CHOLANGIOGRAM OPERATIVE
1 series · 4 of 4 positions shown · non-contrast
Comparison: None

CLINICAL DATA: Abdominal pain

INTRAOPERATIVE CHOLANGIOGRAM
TECHNIQUE: Cholangiographic images from the C-arm fluoroscopic
device were submitted for interpretation post-operatively.  Please
see the procedural report for the amount of contrast and the
fluoroscopy time utilized.

[Series 1: run · 4 of 42 frames shown]
[frame 7/42]
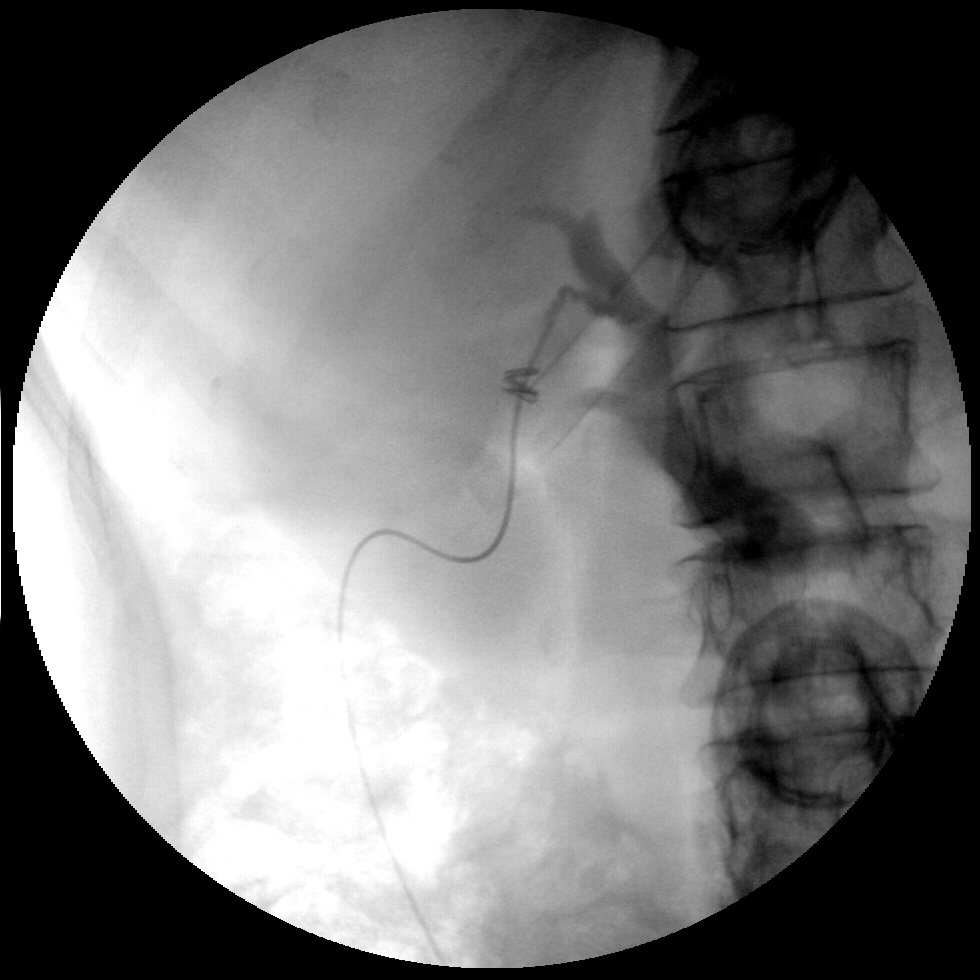
[frame 22/42]
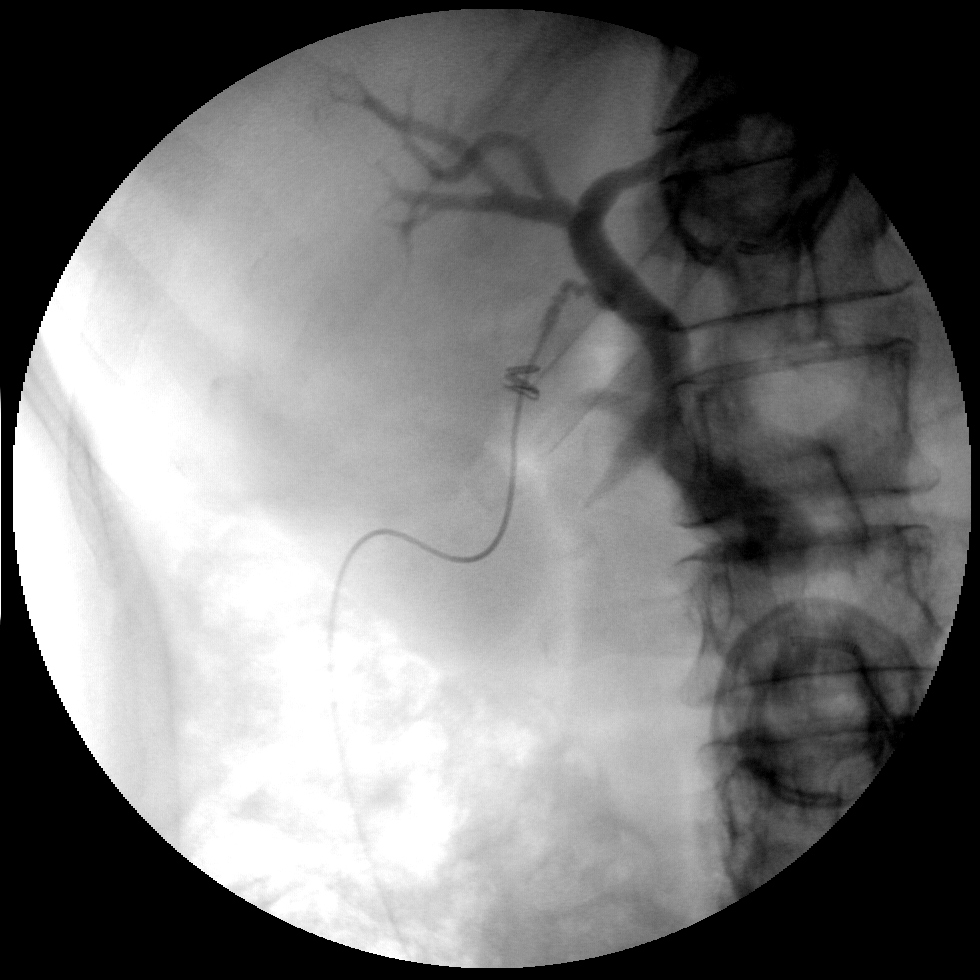
[frame 25/42]
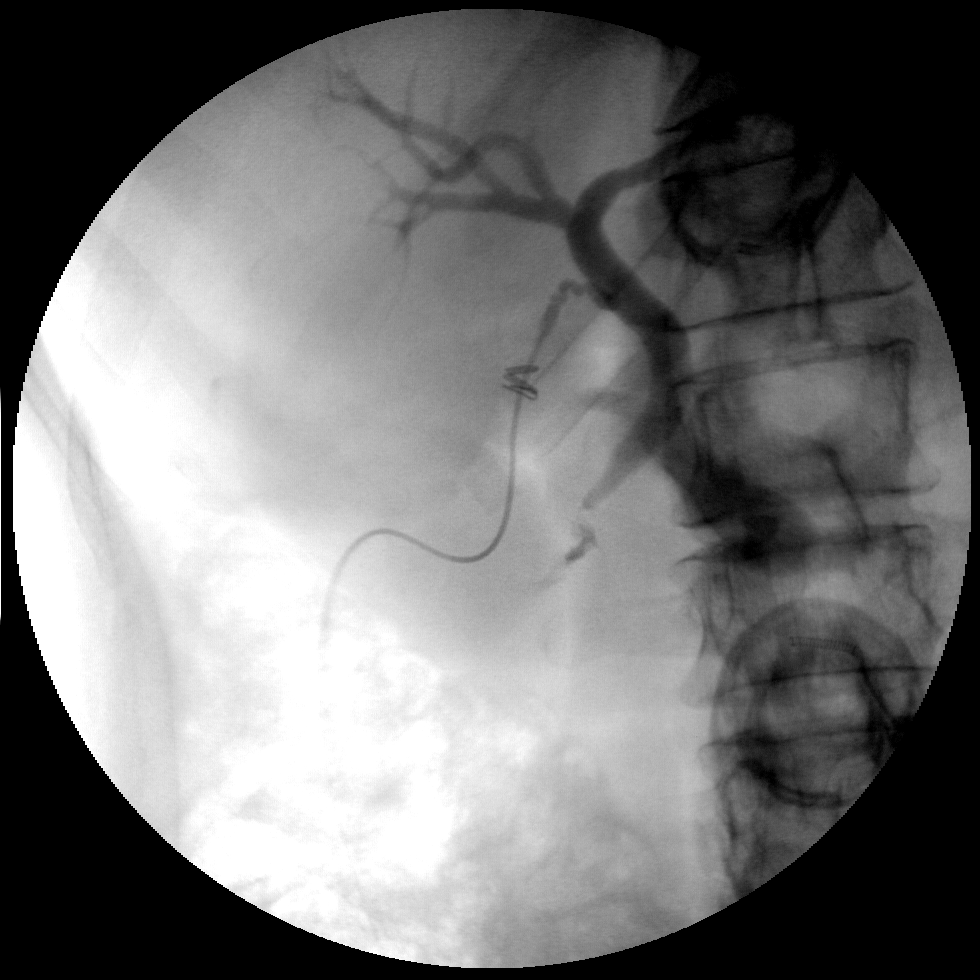
[frame 36/42]
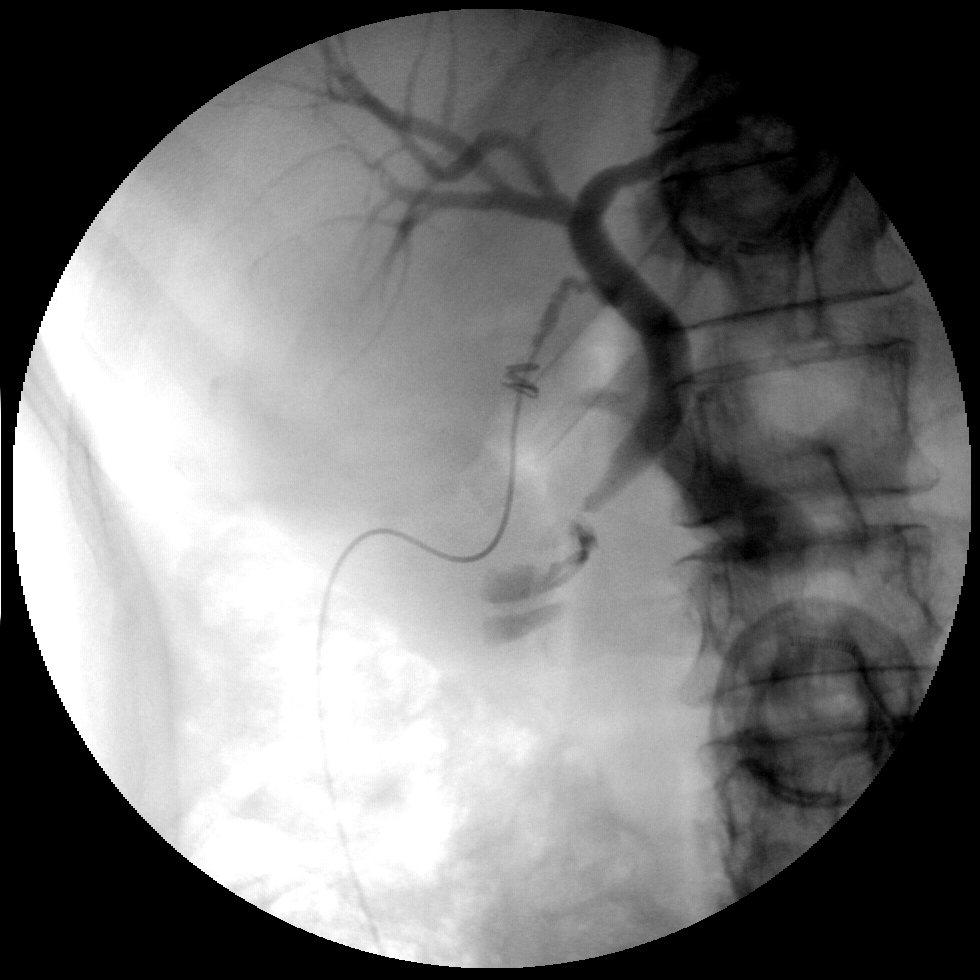

[4 of 4 positions shown; findings below may reference images not displayed]

FINDINGS: No persistent filling defects in the common duct.
Intrahepatic ducts are incompletely visualized, appearing
decompressed centrally. Contrast passes into the duodenum.

IMPRESSION

Negative for retained common duct stone.

## 2013-09-10 DIAGNOSIS — D1801 Hemangioma of skin and subcutaneous tissue: Secondary | ICD-10-CM | POA: Diagnosis not present

## 2013-09-10 DIAGNOSIS — L821 Other seborrheic keratosis: Secondary | ICD-10-CM | POA: Diagnosis not present

## 2013-09-10 DIAGNOSIS — D239 Other benign neoplasm of skin, unspecified: Secondary | ICD-10-CM | POA: Diagnosis not present

## 2013-09-10 DIAGNOSIS — Z808 Family history of malignant neoplasm of other organs or systems: Secondary | ICD-10-CM | POA: Diagnosis not present

## 2013-09-10 DIAGNOSIS — L819 Disorder of pigmentation, unspecified: Secondary | ICD-10-CM | POA: Diagnosis not present

## 2013-10-10 DIAGNOSIS — M8580 Other specified disorders of bone density and structure, unspecified site: Secondary | ICD-10-CM | POA: Diagnosis not present

## 2013-10-10 DIAGNOSIS — I10 Essential (primary) hypertension: Secondary | ICD-10-CM | POA: Diagnosis not present

## 2013-10-10 DIAGNOSIS — E785 Hyperlipidemia, unspecified: Secondary | ICD-10-CM | POA: Diagnosis not present

## 2013-10-10 DIAGNOSIS — Z23 Encounter for immunization: Secondary | ICD-10-CM | POA: Diagnosis not present

## 2013-10-10 DIAGNOSIS — Z1211 Encounter for screening for malignant neoplasm of colon: Secondary | ICD-10-CM | POA: Diagnosis not present

## 2013-10-10 DIAGNOSIS — Z0001 Encounter for general adult medical examination with abnormal findings: Secondary | ICD-10-CM | POA: Diagnosis not present

## 2013-10-10 DIAGNOSIS — E559 Vitamin D deficiency, unspecified: Secondary | ICD-10-CM | POA: Diagnosis not present

## 2013-10-10 DIAGNOSIS — N952 Postmenopausal atrophic vaginitis: Secondary | ICD-10-CM | POA: Diagnosis not present

## 2013-10-15 DIAGNOSIS — D485 Neoplasm of uncertain behavior of skin: Secondary | ICD-10-CM | POA: Diagnosis not present

## 2013-10-15 DIAGNOSIS — L851 Acquired keratosis [keratoderma] palmaris et plantaris: Secondary | ICD-10-CM | POA: Diagnosis not present

## 2013-10-17 DIAGNOSIS — D0471 Carcinoma in situ of skin of right lower limb, including hip: Secondary | ICD-10-CM | POA: Diagnosis not present

## 2013-10-20 DIAGNOSIS — Z1211 Encounter for screening for malignant neoplasm of colon: Secondary | ICD-10-CM | POA: Diagnosis not present

## 2013-10-31 DIAGNOSIS — H43813 Vitreous degeneration, bilateral: Secondary | ICD-10-CM | POA: Diagnosis not present

## 2013-10-31 DIAGNOSIS — H04123 Dry eye syndrome of bilateral lacrimal glands: Secondary | ICD-10-CM | POA: Diagnosis not present

## 2013-10-31 DIAGNOSIS — F321 Major depressive disorder, single episode, moderate: Secondary | ICD-10-CM | POA: Diagnosis not present

## 2013-10-31 DIAGNOSIS — H2513 Age-related nuclear cataract, bilateral: Secondary | ICD-10-CM | POA: Diagnosis not present

## 2013-11-11 DIAGNOSIS — M5408 Panniculitis affecting regions of neck and back, sacral and sacrococcygeal region: Secondary | ICD-10-CM | POA: Diagnosis not present

## 2013-11-11 DIAGNOSIS — M9901 Segmental and somatic dysfunction of cervical region: Secondary | ICD-10-CM | POA: Diagnosis not present

## 2013-11-11 DIAGNOSIS — M9902 Segmental and somatic dysfunction of thoracic region: Secondary | ICD-10-CM | POA: Diagnosis not present

## 2013-11-11 DIAGNOSIS — M62838 Other muscle spasm: Secondary | ICD-10-CM | POA: Diagnosis not present

## 2013-11-20 DIAGNOSIS — M5408 Panniculitis affecting regions of neck and back, sacral and sacrococcygeal region: Secondary | ICD-10-CM | POA: Diagnosis not present

## 2013-11-20 DIAGNOSIS — M9902 Segmental and somatic dysfunction of thoracic region: Secondary | ICD-10-CM | POA: Diagnosis not present

## 2013-11-20 DIAGNOSIS — M9901 Segmental and somatic dysfunction of cervical region: Secondary | ICD-10-CM | POA: Diagnosis not present

## 2013-11-20 DIAGNOSIS — M62838 Other muscle spasm: Secondary | ICD-10-CM | POA: Diagnosis not present

## 2013-11-24 DIAGNOSIS — M5408 Panniculitis affecting regions of neck and back, sacral and sacrococcygeal region: Secondary | ICD-10-CM | POA: Diagnosis not present

## 2013-11-24 DIAGNOSIS — M62838 Other muscle spasm: Secondary | ICD-10-CM | POA: Diagnosis not present

## 2013-11-24 DIAGNOSIS — M9901 Segmental and somatic dysfunction of cervical region: Secondary | ICD-10-CM | POA: Diagnosis not present

## 2013-11-24 DIAGNOSIS — M9902 Segmental and somatic dysfunction of thoracic region: Secondary | ICD-10-CM | POA: Diagnosis not present

## 2013-11-27 DIAGNOSIS — M9904 Segmental and somatic dysfunction of sacral region: Secondary | ICD-10-CM | POA: Diagnosis not present

## 2013-11-27 DIAGNOSIS — M9905 Segmental and somatic dysfunction of pelvic region: Secondary | ICD-10-CM | POA: Diagnosis not present

## 2013-11-27 DIAGNOSIS — M545 Low back pain: Secondary | ICD-10-CM | POA: Diagnosis not present

## 2013-11-27 DIAGNOSIS — M25551 Pain in right hip: Secondary | ICD-10-CM | POA: Diagnosis not present

## 2013-11-27 DIAGNOSIS — M9903 Segmental and somatic dysfunction of lumbar region: Secondary | ICD-10-CM | POA: Diagnosis not present

## 2013-12-02 DIAGNOSIS — M25551 Pain in right hip: Secondary | ICD-10-CM | POA: Diagnosis not present

## 2013-12-02 DIAGNOSIS — M9903 Segmental and somatic dysfunction of lumbar region: Secondary | ICD-10-CM | POA: Diagnosis not present

## 2013-12-02 DIAGNOSIS — M9904 Segmental and somatic dysfunction of sacral region: Secondary | ICD-10-CM | POA: Diagnosis not present

## 2013-12-02 DIAGNOSIS — M9905 Segmental and somatic dysfunction of pelvic region: Secondary | ICD-10-CM | POA: Diagnosis not present

## 2013-12-02 DIAGNOSIS — M545 Low back pain: Secondary | ICD-10-CM | POA: Diagnosis not present

## 2013-12-09 DIAGNOSIS — M25551 Pain in right hip: Secondary | ICD-10-CM | POA: Diagnosis not present

## 2013-12-09 DIAGNOSIS — M9905 Segmental and somatic dysfunction of pelvic region: Secondary | ICD-10-CM | POA: Diagnosis not present

## 2013-12-09 DIAGNOSIS — M9903 Segmental and somatic dysfunction of lumbar region: Secondary | ICD-10-CM | POA: Diagnosis not present

## 2013-12-09 DIAGNOSIS — M545 Low back pain: Secondary | ICD-10-CM | POA: Diagnosis not present

## 2013-12-09 DIAGNOSIS — M9904 Segmental and somatic dysfunction of sacral region: Secondary | ICD-10-CM | POA: Diagnosis not present

## 2013-12-16 DIAGNOSIS — M9904 Segmental and somatic dysfunction of sacral region: Secondary | ICD-10-CM | POA: Diagnosis not present

## 2013-12-16 DIAGNOSIS — M545 Low back pain: Secondary | ICD-10-CM | POA: Diagnosis not present

## 2013-12-16 DIAGNOSIS — M25551 Pain in right hip: Secondary | ICD-10-CM | POA: Diagnosis not present

## 2013-12-16 DIAGNOSIS — M9905 Segmental and somatic dysfunction of pelvic region: Secondary | ICD-10-CM | POA: Diagnosis not present

## 2013-12-16 DIAGNOSIS — M9903 Segmental and somatic dysfunction of lumbar region: Secondary | ICD-10-CM | POA: Diagnosis not present

## 2013-12-25 DIAGNOSIS — M25551 Pain in right hip: Secondary | ICD-10-CM | POA: Diagnosis not present

## 2013-12-25 DIAGNOSIS — M9903 Segmental and somatic dysfunction of lumbar region: Secondary | ICD-10-CM | POA: Diagnosis not present

## 2013-12-25 DIAGNOSIS — M9904 Segmental and somatic dysfunction of sacral region: Secondary | ICD-10-CM | POA: Diagnosis not present

## 2013-12-25 DIAGNOSIS — M545 Low back pain: Secondary | ICD-10-CM | POA: Diagnosis not present

## 2013-12-25 DIAGNOSIS — M9905 Segmental and somatic dysfunction of pelvic region: Secondary | ICD-10-CM | POA: Diagnosis not present

## 2014-04-24 DIAGNOSIS — I10 Essential (primary) hypertension: Secondary | ICD-10-CM | POA: Diagnosis not present

## 2014-04-24 DIAGNOSIS — F321 Major depressive disorder, single episode, moderate: Secondary | ICD-10-CM | POA: Diagnosis not present

## 2014-04-24 DIAGNOSIS — E785 Hyperlipidemia, unspecified: Secondary | ICD-10-CM | POA: Diagnosis not present

## 2014-10-02 DIAGNOSIS — F321 Major depressive disorder, single episode, moderate: Secondary | ICD-10-CM | POA: Diagnosis not present

## 2014-10-30 DIAGNOSIS — Z23 Encounter for immunization: Secondary | ICD-10-CM | POA: Diagnosis not present

## 2014-10-30 DIAGNOSIS — Z1211 Encounter for screening for malignant neoplasm of colon: Secondary | ICD-10-CM | POA: Diagnosis not present

## 2014-10-30 DIAGNOSIS — N952 Postmenopausal atrophic vaginitis: Secondary | ICD-10-CM | POA: Diagnosis not present

## 2014-10-30 DIAGNOSIS — F322 Major depressive disorder, single episode, severe without psychotic features: Secondary | ICD-10-CM | POA: Diagnosis not present

## 2014-10-30 DIAGNOSIS — A609 Anogenital herpesviral infection, unspecified: Secondary | ICD-10-CM | POA: Diagnosis not present

## 2014-10-30 DIAGNOSIS — Z0000 Encounter for general adult medical examination without abnormal findings: Secondary | ICD-10-CM | POA: Diagnosis not present

## 2014-10-30 DIAGNOSIS — E559 Vitamin D deficiency, unspecified: Secondary | ICD-10-CM | POA: Diagnosis not present

## 2014-10-30 DIAGNOSIS — I10 Essential (primary) hypertension: Secondary | ICD-10-CM | POA: Diagnosis not present

## 2014-10-30 DIAGNOSIS — E785 Hyperlipidemia, unspecified: Secondary | ICD-10-CM | POA: Diagnosis not present

## 2014-11-06 DIAGNOSIS — Z1211 Encounter for screening for malignant neoplasm of colon: Secondary | ICD-10-CM | POA: Diagnosis not present

## 2014-11-25 DIAGNOSIS — M79672 Pain in left foot: Secondary | ICD-10-CM | POA: Diagnosis not present

## 2014-11-25 DIAGNOSIS — L84 Corns and callosities: Secondary | ICD-10-CM | POA: Diagnosis not present

## 2014-12-11 DIAGNOSIS — H04121 Dry eye syndrome of right lacrimal gland: Secondary | ICD-10-CM | POA: Diagnosis not present

## 2014-12-11 DIAGNOSIS — H04122 Dry eye syndrome of left lacrimal gland: Secondary | ICD-10-CM | POA: Diagnosis not present

## 2014-12-11 DIAGNOSIS — H2513 Age-related nuclear cataract, bilateral: Secondary | ICD-10-CM | POA: Diagnosis not present

## 2015-02-05 DIAGNOSIS — H04123 Dry eye syndrome of bilateral lacrimal glands: Secondary | ICD-10-CM | POA: Diagnosis not present

## 2015-04-30 DIAGNOSIS — I10 Essential (primary) hypertension: Secondary | ICD-10-CM | POA: Diagnosis not present

## 2015-04-30 DIAGNOSIS — E785 Hyperlipidemia, unspecified: Secondary | ICD-10-CM | POA: Diagnosis not present

## 2015-06-11 DIAGNOSIS — F321 Major depressive disorder, single episode, moderate: Secondary | ICD-10-CM | POA: Diagnosis not present

## 2015-07-21 DIAGNOSIS — D485 Neoplasm of uncertain behavior of skin: Secondary | ICD-10-CM | POA: Diagnosis not present

## 2015-07-21 DIAGNOSIS — L820 Inflamed seborrheic keratosis: Secondary | ICD-10-CM | POA: Diagnosis not present

## 2015-07-30 DIAGNOSIS — E785 Hyperlipidemia, unspecified: Secondary | ICD-10-CM | POA: Diagnosis not present

## 2015-08-13 DIAGNOSIS — H04123 Dry eye syndrome of bilateral lacrimal glands: Secondary | ICD-10-CM | POA: Diagnosis not present

## 2015-09-15 DIAGNOSIS — M7541 Impingement syndrome of right shoulder: Secondary | ICD-10-CM | POA: Diagnosis not present

## 2015-10-15 DIAGNOSIS — M7541 Impingement syndrome of right shoulder: Secondary | ICD-10-CM | POA: Diagnosis not present

## 2015-10-28 DIAGNOSIS — D2271 Melanocytic nevi of right lower limb, including hip: Secondary | ICD-10-CM | POA: Diagnosis not present

## 2015-10-28 DIAGNOSIS — L821 Other seborrheic keratosis: Secondary | ICD-10-CM | POA: Diagnosis not present

## 2015-10-28 DIAGNOSIS — D1800 Hemangioma unspecified site: Secondary | ICD-10-CM | POA: Diagnosis not present

## 2015-10-28 DIAGNOSIS — D485 Neoplasm of uncertain behavior of skin: Secondary | ICD-10-CM | POA: Diagnosis not present

## 2015-10-28 DIAGNOSIS — Z23 Encounter for immunization: Secondary | ICD-10-CM | POA: Diagnosis not present

## 2015-10-28 DIAGNOSIS — L814 Other melanin hyperpigmentation: Secondary | ICD-10-CM | POA: Diagnosis not present

## 2015-10-28 DIAGNOSIS — D225 Melanocytic nevi of trunk: Secondary | ICD-10-CM | POA: Diagnosis not present

## 2015-10-29 DIAGNOSIS — D225 Melanocytic nevi of trunk: Secondary | ICD-10-CM | POA: Diagnosis not present

## 2015-11-12 DIAGNOSIS — E785 Hyperlipidemia, unspecified: Secondary | ICD-10-CM | POA: Diagnosis not present

## 2015-11-12 DIAGNOSIS — Z1211 Encounter for screening for malignant neoplasm of colon: Secondary | ICD-10-CM | POA: Diagnosis not present

## 2015-11-12 DIAGNOSIS — E559 Vitamin D deficiency, unspecified: Secondary | ICD-10-CM | POA: Diagnosis not present

## 2015-11-12 DIAGNOSIS — M8580 Other specified disorders of bone density and structure, unspecified site: Secondary | ICD-10-CM | POA: Diagnosis not present

## 2015-11-12 DIAGNOSIS — I10 Essential (primary) hypertension: Secondary | ICD-10-CM | POA: Diagnosis not present

## 2015-11-12 DIAGNOSIS — Z0000 Encounter for general adult medical examination without abnormal findings: Secondary | ICD-10-CM | POA: Diagnosis not present

## 2015-11-12 DIAGNOSIS — Z23 Encounter for immunization: Secondary | ICD-10-CM | POA: Diagnosis not present

## 2015-11-12 DIAGNOSIS — Z1159 Encounter for screening for other viral diseases: Secondary | ICD-10-CM | POA: Diagnosis not present

## 2015-11-12 DIAGNOSIS — M7541 Impingement syndrome of right shoulder: Secondary | ICD-10-CM | POA: Diagnosis not present

## 2015-11-18 DIAGNOSIS — Z1211 Encounter for screening for malignant neoplasm of colon: Secondary | ICD-10-CM | POA: Diagnosis not present

## 2015-11-26 DIAGNOSIS — F321 Major depressive disorder, single episode, moderate: Secondary | ICD-10-CM | POA: Diagnosis not present

## 2015-12-10 DIAGNOSIS — Z1231 Encounter for screening mammogram for malignant neoplasm of breast: Secondary | ICD-10-CM | POA: Diagnosis not present

## 2015-12-10 DIAGNOSIS — M8589 Other specified disorders of bone density and structure, multiple sites: Secondary | ICD-10-CM | POA: Diagnosis not present

## 2016-02-29 DIAGNOSIS — S6982XA Other specified injuries of left wrist, hand and finger(s), initial encounter: Secondary | ICD-10-CM | POA: Diagnosis not present

## 2016-02-29 DIAGNOSIS — M1812 Unilateral primary osteoarthritis of first carpometacarpal joint, left hand: Secondary | ICD-10-CM | POA: Diagnosis not present

## 2016-05-12 DIAGNOSIS — Z23 Encounter for immunization: Secondary | ICD-10-CM | POA: Diagnosis not present

## 2016-05-12 DIAGNOSIS — L900 Lichen sclerosus et atrophicus: Secondary | ICD-10-CM | POA: Diagnosis not present

## 2016-05-12 DIAGNOSIS — E785 Hyperlipidemia, unspecified: Secondary | ICD-10-CM | POA: Diagnosis not present

## 2016-05-12 DIAGNOSIS — Z1211 Encounter for screening for malignant neoplasm of colon: Secondary | ICD-10-CM | POA: Diagnosis not present

## 2016-05-12 DIAGNOSIS — K625 Hemorrhage of anus and rectum: Secondary | ICD-10-CM | POA: Diagnosis not present

## 2016-05-12 DIAGNOSIS — I10 Essential (primary) hypertension: Secondary | ICD-10-CM | POA: Diagnosis not present

## 2016-08-11 DIAGNOSIS — H524 Presbyopia: Secondary | ICD-10-CM | POA: Diagnosis not present

## 2016-08-11 DIAGNOSIS — H04121 Dry eye syndrome of right lacrimal gland: Secondary | ICD-10-CM | POA: Diagnosis not present

## 2016-08-11 DIAGNOSIS — H04122 Dry eye syndrome of left lacrimal gland: Secondary | ICD-10-CM | POA: Diagnosis not present

## 2016-09-29 DIAGNOSIS — Z23 Encounter for immunization: Secondary | ICD-10-CM | POA: Diagnosis not present

## 2016-10-13 DIAGNOSIS — J0100 Acute maxillary sinusitis, unspecified: Secondary | ICD-10-CM | POA: Diagnosis not present

## 2016-10-13 DIAGNOSIS — A609 Anogenital herpesviral infection, unspecified: Secondary | ICD-10-CM | POA: Diagnosis not present

## 2016-11-09 DIAGNOSIS — M4150 Other secondary scoliosis, site unspecified: Secondary | ICD-10-CM | POA: Diagnosis not present

## 2016-11-09 DIAGNOSIS — M7918 Myalgia, other site: Secondary | ICD-10-CM | POA: Diagnosis not present

## 2016-11-09 DIAGNOSIS — M4316 Spondylolisthesis, lumbar region: Secondary | ICD-10-CM | POA: Diagnosis not present

## 2016-11-23 DIAGNOSIS — L738 Other specified follicular disorders: Secondary | ICD-10-CM | POA: Diagnosis not present

## 2016-11-23 DIAGNOSIS — Z23 Encounter for immunization: Secondary | ICD-10-CM | POA: Diagnosis not present

## 2016-11-23 DIAGNOSIS — L814 Other melanin hyperpigmentation: Secondary | ICD-10-CM | POA: Diagnosis not present

## 2016-11-23 DIAGNOSIS — Z86018 Personal history of other benign neoplasm: Secondary | ICD-10-CM | POA: Diagnosis not present

## 2016-11-23 DIAGNOSIS — D2271 Melanocytic nevi of right lower limb, including hip: Secondary | ICD-10-CM | POA: Diagnosis not present

## 2016-11-23 DIAGNOSIS — B009 Herpesviral infection, unspecified: Secondary | ICD-10-CM | POA: Diagnosis not present

## 2016-11-23 DIAGNOSIS — D225 Melanocytic nevi of trunk: Secondary | ICD-10-CM | POA: Diagnosis not present

## 2016-11-23 DIAGNOSIS — D1800 Hemangioma unspecified site: Secondary | ICD-10-CM | POA: Diagnosis not present

## 2016-11-23 DIAGNOSIS — L570 Actinic keratosis: Secondary | ICD-10-CM | POA: Diagnosis not present

## 2016-11-23 DIAGNOSIS — L821 Other seborrheic keratosis: Secondary | ICD-10-CM | POA: Diagnosis not present

## 2016-11-24 DIAGNOSIS — F321 Major depressive disorder, single episode, moderate: Secondary | ICD-10-CM | POA: Diagnosis not present

## 2017-01-26 DIAGNOSIS — A609 Anogenital herpesviral infection, unspecified: Secondary | ICD-10-CM | POA: Diagnosis not present

## 2017-01-26 DIAGNOSIS — E559 Vitamin D deficiency, unspecified: Secondary | ICD-10-CM | POA: Diagnosis not present

## 2017-01-26 DIAGNOSIS — L900 Lichen sclerosus et atrophicus: Secondary | ICD-10-CM | POA: Diagnosis not present

## 2017-01-26 DIAGNOSIS — Z1231 Encounter for screening mammogram for malignant neoplasm of breast: Secondary | ICD-10-CM | POA: Diagnosis not present

## 2017-01-26 DIAGNOSIS — B351 Tinea unguium: Secondary | ICD-10-CM | POA: Diagnosis not present

## 2017-01-26 DIAGNOSIS — Z0000 Encounter for general adult medical examination without abnormal findings: Secondary | ICD-10-CM | POA: Diagnosis not present

## 2017-01-26 DIAGNOSIS — I10 Essential (primary) hypertension: Secondary | ICD-10-CM | POA: Diagnosis not present

## 2017-01-26 DIAGNOSIS — E785 Hyperlipidemia, unspecified: Secondary | ICD-10-CM | POA: Diagnosis not present

## 2017-02-06 NOTE — Progress Notes (Signed)
[facility] [address], [place] [address] Phone: [phone] Subjective:     CC: Hamstring pain  ION:[id number]  Melody [name] is a 72 y.o. female coming in with complaint of right leg pain. Was told it was her piriformis. Has had laser treatments. Nov. 1st [facility]. Was also told it was her piriformis. Pain her hip that radiates down her leg to her feet. She has numbness and tingling. Says her leg feels weak when she stands on one leg. She also has right lower back pain.   Onset- Mid October Location- Ish. Tub. (proximal hamstring) Aggravating-  Therapies tried- Oral medications    Character- Sharp    Patient did have back x-rays done from the outside facility.  Report was made available to me.  Found to have spinal listhesis at multiple levels with degenerative scoliosis of the lumbar spine with degenerative disc disease. Past Medical History:  Diagnosis Date  . Hypertension    Past Surgical History:  Procedure Laterality Date  . BREAST RECONSTRUCTION  1980   breast implant placement  . BREAST RECONSTRUCTION  1994   breast implant removal  . CESAREAN SECTION    . TONSILECTOMY, ADENOIDECTOMY, BILATERAL MYRINGOTOMY AND TUBES  1955   Social History   Socioeconomic History  . Marital status: Divorced    Spouse name: None  . Number of children: None  . Years of education: None  . Highest education level: None  Social Needs  . Financial resource strain: None  . Food insecurity - worry: None  . Food insecurity - inability: None  . Transportation needs - medical: None  . Transportation needs - non-medical: None  Occupational History  . None  Tobacco Use  . Smoking status: Never Smoker  . Smokeless tobacco: Never Used  Substance and Sexual Activity  . Alcohol use: No  . Drug use: No  . Sexual activity: None  Other Topics Concern  . None  Social History Narrative  . None   Allergies  Allergen Reactions  . Zolpidem  Tartrate    Family History  Problem Relation Age of Onset  . Heart disease Father      Past medical history, social, surgical and family history all reviewed in electronic medical record.  No pertanent information unless stated regarding to the chief complaint.   Review of Systems:Review of systems updated and as accurate as of 02/07/17  No headache, visual changes, nausea, vomiting, diarrhea, constipation, dizziness, abdominal pain, skin rash, fevers, chills, night sweats, weight loss, swollen lymph nodes, body aches, joint swelling, chest pain, shortness of breath, mood changes.  Positive muscle aches  Objective  Blood pressure (!) 150/90, pulse 71, height 5\' 5"  (1.651 m), weight 120 lb (54.4 kg), SpO2 91 %. Systems examined below as of 02/07/17   General: No apparent distress alert and oriented x3 mood and affect normal, dressed appropriately.  HEENT: Pupils equal, extraocular movements intact  Respiratory: Patient's speak in full sentences and does not appear short of breath  Cardiovascular: No lower extremity edema, non tender, no erythema  Skin: Warm dry intact with no signs of infection or rash on extremities or on axial skeleton.  Abdomen: Soft nontender  Neuro: Cranial nerves II through XII are intact, neurovascularly intact in all extremities with 2+ DTRs and 2+ pulses.  Lymph: No lymphadenopathy of posterior or anterior cervical chain or axillae bilaterally.  Gait antalgic gait MSK:  Non tender with full range of motion and good stability  and symmetric strength and tone of shoulders, elbows, wrist, hip, knee and ankles bilaterally.   Back Exam:  Inspection: Degenerative scoliosis noted with significant loss of lordosis.  Atrophy of the buttocks musculature bilaterally Motion: Flexion 15 deg, Extension 5 deg, Side Bending to 25 deg bilaterally,  Rotation to 25 deg bilaterally  SLR laying: Positive right XSLR laying: Positive with right-sided radicular symptoms Palpable  tenderness: Tender to palpation over the piriformis and right SIJ. FABER: significant tightness.  Right side. Sensory change: Gross sensation intact to all lumbar and sacral dermatomes.  Reflexes: 2+ at both patellar tendons, 1+ at achilles tendons on right, Babinski's downgoing.  Strength at foot  Plantar-flexion: 4+/5 Dorsi-flexion: 5/5 Eversion: 5/5 Inversion: 5/5  Leg strength  Quad: 4/5 on right  Hamstring: 4/5 symmetric Hip flexor: 4/5 Hip abductors: 4/5      Impression and Recommendations:     This case required medical decision making of moderate complexity.      Note: This dictation was prepared with Dragon dictation along with smaller phrase technology. Any transcriptional errors that result from this process are unintentional.

## 2017-02-07 ENCOUNTER — Ambulatory Visit (INDEPENDENT_AMBULATORY_CARE_PROVIDER_SITE_OTHER): Payer: PPO | Admitting: Family Medicine

## 2017-02-07 ENCOUNTER — Ambulatory Visit (INDEPENDENT_AMBULATORY_CARE_PROVIDER_SITE_OTHER)
Admission: RE | Admit: 2017-02-07 | Discharge: 2017-02-07 | Disposition: A | Discharge status: Home / Self Care | Payer: PPO | Source: Ambulatory Visit | Attending: Family Medicine | Admitting: Family Medicine

## 2017-02-07 ENCOUNTER — Encounter: Payer: Self-pay | Admitting: Family Medicine

## 2017-02-07 VITALS — BP 150/90 | HR 71 | Ht 65.0 in | Wt 120.0 lb

## 2017-02-07 DIAGNOSIS — M25551 Pain in right hip: Secondary | ICD-10-CM | POA: Diagnosis not present

## 2017-02-07 DIAGNOSIS — M5416 Radiculopathy, lumbar region: Secondary | ICD-10-CM | POA: Diagnosis not present

## 2017-02-07 DIAGNOSIS — M4808 Spinal stenosis, sacral and sacrococcygeal region: Secondary | ICD-10-CM

## 2017-02-07 DIAGNOSIS — M48061 Spinal stenosis, lumbar region without neurogenic claudication: Secondary | ICD-10-CM | POA: Insufficient documentation

## 2017-02-07 MED ORDER — GABAPENTIN 100 MG PO CAPS: 200.0000 mg | ORAL_CAPSULE | Freq: Every day | ORAL | 3 refills | Status: DC

## 2017-02-07 MED ORDER — MELOXICAM 15 MG PO TABS: 15.0000 mg | ORAL_TABLET | Freq: Every day | ORAL | 0 refills | Status: DC

## 2017-02-07 NOTE — Assessment & Plan Note (Signed)
Spinal stenosis. Weakness of the right leg.  1+DTR on right achilles. Concern for stenosis.  Discussed HEP, but will get MRI and will be candidate for epidurals if needed.  Due to weakness may need surgical interventions.  Patient started gabapentin vitamin D given as well.  We will await imaging.  Worsening symptoms to go to the emergency room immediately.

## 2017-02-07 NOTE — Patient Instructions (Addendum)
Good to see you  Ice 20 minutes 2 times daily. Usually after activity and before bed. Thigh compression sleeve could help.  Even something at [facility] may help if it is the leg Avoid back extension for now. Tell Richard.  Gabapentin 200mg  at night  Xray of hip downstairs.  We will get MRI of your back to rule out spinal stenosis  I will write you and tell you the results and discuss next steps

## 2017-02-08 ENCOUNTER — Encounter: Payer: Self-pay | Admitting: Family Medicine

## 2017-02-14 ENCOUNTER — Ambulatory Visit
Admission: RE | Admit: 2017-02-14 | Discharge: 2017-02-14 | Disposition: A | Discharge status: Home / Self Care | Payer: PPO | Source: Ambulatory Visit | Attending: Family Medicine | Admitting: Family Medicine

## 2017-02-14 ENCOUNTER — Encounter: Payer: Self-pay | Admitting: Family Medicine

## 2017-02-14 DIAGNOSIS — M5416 Radiculopathy, lumbar region: Principal | ICD-10-CM

## 2017-02-14 DIAGNOSIS — M48061 Spinal stenosis, lumbar region without neurogenic claudication: Secondary | ICD-10-CM

## 2017-02-14 DIAGNOSIS — M4808 Spinal stenosis, sacral and sacrococcygeal region: Secondary | ICD-10-CM

## 2017-02-18 ENCOUNTER — Encounter: Payer: Self-pay | Admitting: Family Medicine

## 2017-02-19 MED ORDER — PREDNISONE 50 MG PO TABS: 50.0000 mg | ORAL_TABLET | Freq: Every day | ORAL | 0 refills | Status: DC

## 2017-02-20 ENCOUNTER — Telehealth: Payer: Self-pay | Admitting: Family Medicine

## 2017-02-20 NOTE — Telephone Encounter (Signed)
Copied from [facility]. Topic: Quick Communication - See Telephone Encounter >> Feb 20, 2017 10:57 AM Hewitt Shorts wrote: CRM for notification. See Telephone encounter for: pt had an appt on 02/23/17 with Dr. Tamala Julian so that her and her son could discuss the epidural injections process because they were not sure when the appt was going to be schedule but they called and stated that it was scheduled for 02/22/17 and pt  cancelled firday apt but would like to know what the next step would be after Thursday   Best number   02/20/17.

## 2017-02-20 NOTE — Telephone Encounter (Signed)
Spoke to pt, advised her the next step would be to f/u with dr Tamala Julian 2 weeks after the epidural. Scheduled pt for 3.1.19.

## 2017-02-22 ENCOUNTER — Ambulatory Visit
Admission: RE | Admit: 2017-02-22 | Discharge: 2017-02-22 | Disposition: A | Discharge status: Home / Self Care | Payer: PPO | Source: Ambulatory Visit | Attending: Family Medicine | Admitting: Family Medicine

## 2017-02-22 DIAGNOSIS — M5416 Radiculopathy, lumbar region: Principal | ICD-10-CM

## 2017-02-22 DIAGNOSIS — M48061 Spinal stenosis, lumbar region without neurogenic claudication: Secondary | ICD-10-CM

## 2017-02-22 MED ORDER — METHYLPREDNISOLONE ACETATE 40 MG/ML INJ SUSP (RADIOLOG
120.0000 mg | Freq: Once | INTRAMUSCULAR | Status: AC
  Administered 2017-02-22: 120 mg via EPIDURAL

## 2017-02-22 MED ORDER — IOPAMIDOL (ISOVUE-M 200) INJECTION 41%
1.0000 mL | Freq: Once | INTRAMUSCULAR | Status: AC
  Administered 2017-02-22: 1 mL via EPIDURAL

## 2017-02-22 NOTE — Discharge Instructions (Signed)

## 2017-02-23 ENCOUNTER — Ambulatory Visit: Payer: Self-pay | Admitting: Family Medicine

## 2017-02-26 ENCOUNTER — Other Ambulatory Visit: Payer: PPO

## 2017-03-07 NOTE — Progress Notes (Signed)
[facility] [place], [place] [address] Phone: [phone] Subjective:    I'm seeing this patient by the request  of:    CC: Back pain follow-up  BJY:NWGNFAOZHY  Melody [name] is a 72 y.o. female coming in with complaint of back pain.  Patient was found to have severe spinal stenosis at L4-L5.  Patient elected to try an epidural.  Patient had this done on February 22, 2017.  Patient was to continue all other medications.  Patient states that the pain has decreased since the epidural. She continues to have a burning sensation in the right hip that radiates down to her foot when sitting for prolonged periods of time or driving.     Past Medical History:  Diagnosis Date  . Hypertension    Past Surgical History:  Procedure Laterality Date  . BREAST RECONSTRUCTION  1980   breast implant placement  . BREAST RECONSTRUCTION  1994   breast implant removal  . CESAREAN SECTION    . TONSILECTOMY, ADENOIDECTOMY, BILATERAL MYRINGOTOMY AND TUBES  1955   Social History   Socioeconomic History  . Marital status: Divorced    Spouse name: None  . Number of children: None  . Years of education: None  . Highest education level: None  Social Needs  . Financial resource strain: None  . Food insecurity - worry: None  . Food insecurity - inability: None  . Transportation needs - medical: None  . Transportation needs - non-medical: None  Occupational History  . None  Tobacco Use  . Smoking status: Never Smoker  . Smokeless tobacco: Never Used  Substance and Sexual Activity  . Alcohol use: No  . Drug use: No  . Sexual activity: None  Other Topics Concern  . None  Social History Narrative  . None   Allergies  Allergen Reactions  . Zolpidem Tartrate    Family History  Problem Relation Age of Onset  . Heart disease Father      Past medical history, social, surgical and family history all reviewed in electronic medical record.  No pertanent  information unless stated regarding to the chief complaint.   Review of Systems:Review of systems updated and as accurate as of 03/09/17  No headache, visual changes, nausea, vomiting, diarrhea, constipation, dizziness, abdominal pain, skin rash, fevers, chills, night sweats, weight loss, swollen lymph nodes, body aches, joint swelling, chest pain, shortness of breath, mood changes.  Mild positive muscle aches  Objective  Blood pressure (!) 148/100, pulse 77, height 5\' 5"  (1.651 m), weight 122 lb (55.3 kg), SpO2 98 %. Systems examined below as of 03/09/17   General: No apparent distress alert and oriented x3 mood and affect normal, dressed appropriately.  HEENT: Pupils equal, extraocular movements intact  Respiratory: Patient's speak in full sentences and does not appear short of breath  Cardiovascular: No lower extremity edema, non tender, no erythema  Skin: Warm dry intact with no signs of infection or rash on extremities or on axial skeleton.  Abdomen: Soft nontender  Neuro: Cranial nerves II through XII are intact, neurovascularly intact in all extremities with 2+ DTRs and 2+ pulses.  Lymph: No lymphadenopathy of posterior or anterior cervical chain or axillae bilaterally.  Gait normal with good balance and coordination.  Improvement from previous exam MSK:  Non tender with full range of motion and good stability and symmetric strength and tone of shoulders, elbows, wrist, hip, knee and ankles bilaterally.  Arthritic changes of multiple  joints Back Exam:  Inspection: Significant loss of lordosis with some mild degenerative scoliosis Motion: Flexion 45 deg, Extension 15 deg, Side Bending to 35 deg bilaterally,  Rotation to 45 deg bilaterally  SLR laying: Significant tightness in the right XSLR laying: Negative  Palpable tenderness: Tenderness to palpation of the paraspinal musculature lumbar spine right greater than left. FABER: Tightness bilaterally but worse on the right. Sensory  change: Gross sensation intact to all lumbar and sacral dermatomes.  Reflexes: 2+ at both patellar tendons, 2+ at achilles tendons, Babinski's downgoing.  Strength at foot  4 out of 5 strength on the right compared to the left.  Improvement in strength on the left side    Impression and Recommendations:     This case required medical decision making of moderate complexity.      Note: This dictation was prepared with Dragon dictation along with smaller phrase technology. Any transcriptional errors that result from this process are unintentional.

## 2017-03-09 ENCOUNTER — Ambulatory Visit (INDEPENDENT_AMBULATORY_CARE_PROVIDER_SITE_OTHER): Payer: PPO | Admitting: Family Medicine

## 2017-03-09 ENCOUNTER — Encounter: Payer: Self-pay | Admitting: Family Medicine

## 2017-03-09 VITALS — BP 148/100 | HR 77 | Ht 65.0 in | Wt 122.0 lb

## 2017-03-09 DIAGNOSIS — M5416 Radiculopathy, lumbar region: Secondary | ICD-10-CM | POA: Diagnosis not present

## 2017-03-09 DIAGNOSIS — N765 Ulceration of vagina: Secondary | ICD-10-CM | POA: Diagnosis not present

## 2017-03-09 DIAGNOSIS — M48061 Spinal stenosis, lumbar region without neurogenic claudication: Secondary | ICD-10-CM | POA: Diagnosis not present

## 2017-03-09 DIAGNOSIS — Z79899 Other long term (current) drug therapy: Secondary | ICD-10-CM | POA: Diagnosis not present

## 2017-03-09 DIAGNOSIS — L900 Lichen sclerosus et atrophicus: Secondary | ICD-10-CM | POA: Diagnosis not present

## 2017-03-09 DIAGNOSIS — B351 Tinea unguium: Secondary | ICD-10-CM | POA: Diagnosis not present

## 2017-03-09 DIAGNOSIS — I10 Essential (primary) hypertension: Secondary | ICD-10-CM | POA: Diagnosis not present

## 2017-03-09 NOTE — Patient Instructions (Signed)
Good to  See yo u Melody [name] is your friend We will try another injection.  We will get you with physical therapy  When you work with richard tel him you have spinal stenosis and will need to avoid back extensions.  If going to gym on own machines would be better to start and keep hands within peripheral vision  See me again 3 weeks AFTER the injection

## 2017-03-09 NOTE — Assessment & Plan Note (Signed)
Significant spinal stenosis mostly at L4-L5.  Severe in nature.  Patient did respond well to the epidural.  Patient wants to have a repeat.  Order today.  Discussed into great length as well and over for 25 minutes face-to-face we did discuss the possibility of formal physical therapy.  Patient is in agreement of trying that as well.  Hold on starting with her personal trainer again.  Follow-up again with me in 4 weeks

## 2017-03-11 ENCOUNTER — Encounter: Payer: Self-pay | Admitting: Family Medicine

## 2017-03-13 DIAGNOSIS — M545 Low back pain: Secondary | ICD-10-CM | POA: Diagnosis not present

## 2017-03-16 ENCOUNTER — Ambulatory Visit
Admission: RE | Admit: 2017-03-16 | Discharge: 2017-03-16 | Disposition: A | Discharge status: Home / Self Care | Payer: PPO | Source: Ambulatory Visit | Attending: Family Medicine | Admitting: Family Medicine

## 2017-03-16 DIAGNOSIS — M5416 Radiculopathy, lumbar region: Secondary | ICD-10-CM

## 2017-03-16 DIAGNOSIS — M47817 Spondylosis without myelopathy or radiculopathy, lumbosacral region: Secondary | ICD-10-CM | POA: Diagnosis not present

## 2017-03-16 MED ORDER — METHYLPREDNISOLONE ACETATE 40 MG/ML INJ SUSP (RADIOLOG
120.0000 mg | Freq: Once | INTRAMUSCULAR | Status: AC
  Administered 2017-03-16: 120 mg via EPIDURAL

## 2017-03-16 MED ORDER — IOPAMIDOL (ISOVUE-M 200) INJECTION 41%
1.0000 mL | Freq: Once | INTRAMUSCULAR | Status: AC
  Administered 2017-03-16: 1 mL via EPIDURAL

## 2017-03-16 NOTE — Discharge Instructions (Signed)

## 2017-03-19 ENCOUNTER — Other Ambulatory Visit: Payer: PPO

## 2017-03-22 DIAGNOSIS — M545 Low back pain: Secondary | ICD-10-CM | POA: Diagnosis not present

## 2017-03-27 DIAGNOSIS — M545 Low back pain: Secondary | ICD-10-CM | POA: Diagnosis not present

## 2017-03-28 DIAGNOSIS — A609 Anogenital herpesviral infection, unspecified: Secondary | ICD-10-CM | POA: Diagnosis not present

## 2017-03-28 DIAGNOSIS — N762 Acute vulvitis: Secondary | ICD-10-CM | POA: Diagnosis not present

## 2017-03-30 DIAGNOSIS — M545 Low back pain: Secondary | ICD-10-CM | POA: Diagnosis not present

## 2017-04-03 DIAGNOSIS — M545 Low back pain: Secondary | ICD-10-CM | POA: Diagnosis not present

## 2017-04-06 DIAGNOSIS — M545 Low back pain: Secondary | ICD-10-CM | POA: Diagnosis not present

## 2017-04-06 DIAGNOSIS — H8112 Benign paroxysmal vertigo, left ear: Secondary | ICD-10-CM | POA: Diagnosis not present

## 2017-04-09 NOTE — Progress Notes (Signed)
[facility] [place], [place] [address] Phone: [phone] Subjective:     CC: back pain follow up   BJY:NWGNFAOZHY  Melody [name] is a 72 y.o. female coming in with complaint of back pain. MRI showed severe spinal stenosis L4/5.  Had epidural injection last one on March 16, 2017.Marland Kitchen Patient was to continue HEP.  Patient states that she is doing much better. She is doing PT at [facility]. She is doing better with therapy but does not feel that the injection helped.  Patient is states that it did not make as much improvement as stated previously.     MRI of the lumbar spine was taken on February 14, 2017.  Found to have advanced disc and facet degeneration at L4-L5 with a 7 mm anterior listhesis with moderate to severe spinal stenosis.  Past Medical History:  Diagnosis Date  . Hypertension    Past Surgical History:  Procedure Laterality Date  . BREAST RECONSTRUCTION  1980   breast implant placement  . BREAST RECONSTRUCTION  1994   breast implant removal  . CESAREAN SECTION    . TONSILECTOMY, ADENOIDECTOMY, BILATERAL MYRINGOTOMY AND TUBES  1955   Social History   Socioeconomic History  . Marital status: Divorced    Spouse name: Not on file  . Number of children: Not on file  . Years of education: Not on file  . Highest education level: Not on file  Occupational History  . Not on file  Social Needs  . Financial resource strain: Not on file  . Food insecurity:    Worry: Not on file    Inability: Not on file  . Transportation needs:    Medical: Not on file    Non-medical: Not on file  Tobacco Use  . Smoking status: Never Smoker  . Smokeless tobacco: Never Used  Substance and Sexual Activity  . Alcohol use: No  . Drug use: No  . Sexual activity: Not on file  Lifestyle  . Physical activity:    Days per week: Not on file    Minutes per session: Not on file  . Stress: Not on file  Relationships  . Social  connections:    Talks on phone: Not on file    Gets together: Not on file    Attends religious service: Not on file    Active member of club or organization: Not on file    Attends meetings of clubs or organizations: Not on file    Relationship status: Not on file  Other Topics Concern  . Not on file  Social History Narrative  . Not on file   Allergies  Allergen Reactions  . Zolpidem Tartrate Other (See Comments)    Talks on phone and won't remember doing so, etc.   Family History  Problem Relation Age of Onset  . Heart disease Father      Past medical history, social, surgical and family history all reviewed in electronic medical record.  No pertanent information unless stated regarding to the chief complaint.   Review of Systems:Review of systems updated and as accurate as of 04/10/17  No headache, visual changes, nausea, vomiting, diarrhea, constipation, dizziness, abdominal pain, skin rash, fevers, chills, night sweats, weight loss, swollen lymph nodes, body aches, joint swelling, muscle aches, chest pain, shortness of breath, mood changes.   Objective  Blood pressure 112/78, pulse 88, height 5\' 5"  (1.651 m), weight 119 lb (54 kg), SpO2 98 %.  Systems examined below as of 04/10/17   General: No apparent distress alert and oriented x3 mood and affect normal, dressed appropriately.  HEENT: Pupils equal, extraocular movements intact  Respiratory: Patient's speak in full sentences and does not appear short of breath  Cardiovascular: No lower extremity edema, non tender, no erythema  Skin: Warm dry intact with no signs of infection or rash on extremities or on axial skeleton.  Abdomen: Soft nontender  Neuro: Cranial nerves II through XII are intact, neurovascularly intact in all extremities with 2+ DTRs and 2+ pulses.  Lymph: No lymphadenopathy of posterior or anterior cervical chain or axillae bilaterally.  Gait normal with good balance and coordination.  MSK:  Non tender with  full range of motion and good stability and symmetric strength and tone of shoulders, elbows, wrist, hip, knee and ankles bilaterally.  Arthritic changes of multiple joints Back exam shows the patient does have some degenerative scoliosis.  Still has some decreased range of motion in all planes.  Significant tightness of the hamstrings but no radicular symptoms.  Positive Faber on the right side.  Neurovascularly intact distally.  Tender to palpation in the paraspinal musculature.     Impression and Recommendations:     This case required medical decision making of moderate complexity.      Note: This dictation was prepared with Dragon dictation along with smaller phrase technology. Any transcriptional errors that result from this process are unintentional.

## 2017-04-10 ENCOUNTER — Encounter: Payer: Self-pay | Admitting: Family Medicine

## 2017-04-10 ENCOUNTER — Ambulatory Visit (INDEPENDENT_AMBULATORY_CARE_PROVIDER_SITE_OTHER): Payer: PPO | Admitting: Family Medicine

## 2017-04-10 DIAGNOSIS — M48061 Spinal stenosis, lumbar region without neurogenic claudication: Secondary | ICD-10-CM | POA: Diagnosis not present

## 2017-04-10 DIAGNOSIS — M5416 Radiculopathy, lumbar region: Secondary | ICD-10-CM

## 2017-04-10 NOTE — Patient Instructions (Signed)
Good to see you  Ice 20 minutes 2 times daily. Usually after activity and before bed.  We can change the gabapentin and cymbalta but lets watch  Start working with Delfino Lovett but focus on machine weights first and 2 times a week  Keep doing the exercises OK to start decreasing the vitamins but stop 1 a week and see how you feel.  If you get worse then need to take that medicine.  Gabapentin could be making you have some trouble with the mind being sharpe and can decrease to 1 pill at night Continue with PT as long as you feel it is helping Send me a message in 1 month and tell me how you are doing See me again in 2 months

## 2017-04-10 NOTE — Assessment & Plan Note (Signed)
Severe overall. Patient though has been doing relatively well.  We did discuss about the potential of changing patient's gabapentin or Cymbalta.  Patient was to continue the same dosing.  Discussed the over-the-counter medications.  We discussed icing regimen.  Discussed starting to go back to the gym on a slow basis.  Patient is in agreement with the plan.  Follow-up again in 2 months

## 2017-04-13 DIAGNOSIS — H8112 Benign paroxysmal vertigo, left ear: Secondary | ICD-10-CM | POA: Diagnosis not present

## 2017-04-13 DIAGNOSIS — M545 Low back pain: Secondary | ICD-10-CM | POA: Diagnosis not present

## 2017-04-17 DIAGNOSIS — H8112 Benign paroxysmal vertigo, left ear: Secondary | ICD-10-CM | POA: Diagnosis not present

## 2017-04-17 DIAGNOSIS — M545 Low back pain: Secondary | ICD-10-CM | POA: Diagnosis not present

## 2017-04-20 DIAGNOSIS — H8112 Benign paroxysmal vertigo, left ear: Secondary | ICD-10-CM | POA: Diagnosis not present

## 2017-04-20 DIAGNOSIS — M545 Low back pain: Secondary | ICD-10-CM | POA: Diagnosis not present

## 2017-04-24 DIAGNOSIS — M545 Low back pain: Secondary | ICD-10-CM | POA: Diagnosis not present

## 2017-04-24 DIAGNOSIS — H8112 Benign paroxysmal vertigo, left ear: Secondary | ICD-10-CM | POA: Diagnosis not present

## 2017-04-27 DIAGNOSIS — H8112 Benign paroxysmal vertigo, left ear: Secondary | ICD-10-CM | POA: Diagnosis not present

## 2017-04-27 DIAGNOSIS — M545 Low back pain: Secondary | ICD-10-CM | POA: Diagnosis not present

## 2017-05-01 ENCOUNTER — Encounter: Payer: Self-pay | Admitting: Family Medicine

## 2017-05-01 DIAGNOSIS — M545 Low back pain: Secondary | ICD-10-CM | POA: Diagnosis not present

## 2017-05-01 DIAGNOSIS — H8112 Benign paroxysmal vertigo, left ear: Secondary | ICD-10-CM | POA: Diagnosis not present

## 2017-05-02 ENCOUNTER — Other Ambulatory Visit: Payer: Self-pay

## 2017-05-02 DIAGNOSIS — M48061 Spinal stenosis, lumbar region without neurogenic claudication: Secondary | ICD-10-CM

## 2017-05-03 MED ORDER — PREDNISONE 50 MG PO TABS: 50.0000 mg | ORAL_TABLET | Freq: Every day | ORAL | 0 refills | Status: DC

## 2017-05-16 ENCOUNTER — Encounter: Payer: Self-pay | Admitting: Family Medicine

## 2017-05-16 DIAGNOSIS — M5416 Radiculopathy, lumbar region: Principal | ICD-10-CM

## 2017-05-16 DIAGNOSIS — M48061 Spinal stenosis, lumbar region without neurogenic claudication: Secondary | ICD-10-CM

## 2017-05-17 ENCOUNTER — Encounter: Payer: Self-pay | Admitting: Family Medicine

## 2017-05-17 DIAGNOSIS — M542 Cervicalgia: Secondary | ICD-10-CM | POA: Diagnosis not present

## 2017-05-17 DIAGNOSIS — M545 Low back pain: Secondary | ICD-10-CM | POA: Diagnosis not present

## 2017-05-20 ENCOUNTER — Encounter: Payer: Self-pay | Admitting: Family Medicine

## 2017-05-22 ENCOUNTER — Other Ambulatory Visit: Payer: Self-pay | Admitting: Neurosurgery

## 2017-05-22 DIAGNOSIS — M4316 Spondylolisthesis, lumbar region: Secondary | ICD-10-CM | POA: Diagnosis not present

## 2017-05-24 ENCOUNTER — Encounter (HOSPITAL_COMMUNITY): Payer: Self-pay | Admitting: *Deleted

## 2017-05-24 ENCOUNTER — Other Ambulatory Visit: Payer: Self-pay

## 2017-05-25 ENCOUNTER — Inpatient Hospital Stay (HOSPITAL_COMMUNITY): Payer: PPO

## 2017-05-25 ENCOUNTER — Other Ambulatory Visit: Payer: Self-pay

## 2017-05-25 ENCOUNTER — Encounter (HOSPITAL_COMMUNITY): Payer: Self-pay | Admitting: *Deleted

## 2017-05-25 ENCOUNTER — Inpatient Hospital Stay (HOSPITAL_COMMUNITY)
Admission: RE | Admit: 2017-05-25 | Discharge: 2017-05-28 | DRG: 460 | Disposition: A | Discharge status: Home / Self Care | Payer: PPO | Source: Ambulatory Visit | Attending: Neurosurgery | Admitting: Neurosurgery

## 2017-05-25 ENCOUNTER — Inpatient Hospital Stay (HOSPITAL_COMMUNITY): Payer: PPO | Admitting: Certified Registered"

## 2017-05-25 ENCOUNTER — Encounter (HOSPITAL_COMMUNITY)
Admission: RE | Disposition: A | Discharge status: Home / Self Care | Payer: Self-pay | Source: Ambulatory Visit | Attending: Neurosurgery

## 2017-05-25 DIAGNOSIS — Z419 Encounter for procedure for purposes other than remedying health state, unspecified: Secondary | ICD-10-CM

## 2017-05-25 DIAGNOSIS — Z888 Allergy status to other drugs, medicaments and biological substances status: Secondary | ICD-10-CM

## 2017-05-25 DIAGNOSIS — I10 Essential (primary) hypertension: Secondary | ICD-10-CM | POA: Diagnosis present

## 2017-05-25 DIAGNOSIS — R402414 Glasgow coma scale score 13-15, 24 hours or more after hospital admission: Secondary | ICD-10-CM | POA: Diagnosis not present

## 2017-05-25 DIAGNOSIS — F329 Major depressive disorder, single episode, unspecified: Secondary | ICD-10-CM | POA: Diagnosis present

## 2017-05-25 DIAGNOSIS — G541 Lumbosacral plexus disorders: Secondary | ICD-10-CM | POA: Diagnosis present

## 2017-05-25 DIAGNOSIS — Z7982 Long term (current) use of aspirin: Secondary | ICD-10-CM | POA: Diagnosis not present

## 2017-05-25 DIAGNOSIS — Z9882 Breast implant status: Secondary | ICD-10-CM

## 2017-05-25 DIAGNOSIS — M5126 Other intervertebral disc displacement, lumbar region: Secondary | ICD-10-CM | POA: Diagnosis not present

## 2017-05-25 DIAGNOSIS — M4326 Fusion of spine, lumbar region: Secondary | ICD-10-CM | POA: Diagnosis not present

## 2017-05-25 DIAGNOSIS — Z79899 Other long term (current) drug therapy: Secondary | ICD-10-CM

## 2017-05-25 DIAGNOSIS — M4316 Spondylolisthesis, lumbar region: Principal | ICD-10-CM | POA: Diagnosis present

## 2017-05-25 DIAGNOSIS — M48061 Spinal stenosis, lumbar region without neurogenic claudication: Secondary | ICD-10-CM | POA: Diagnosis present

## 2017-05-25 DIAGNOSIS — M5106 Intervertebral disc disorders with myelopathy, lumbar region: Secondary | ICD-10-CM | POA: Diagnosis not present

## 2017-05-25 HISTORY — DX: Major depressive disorder, single episode, unspecified: F32.9

## 2017-05-25 HISTORY — DX: Depression, unspecified: F32.A

## 2017-05-25 LAB — BASIC METABOLIC PANEL
Anion gap: 6 (ref 5–15)
BUN: 16 mg/dL (ref 6–20)
CO2: 30 mmol/L (ref 22–32)
Calcium: 9.4 mg/dL (ref 8.9–10.3)
Chloride: 103 mmol/L (ref 101–111)
Creatinine, Ser: 0.84 mg/dL (ref 0.44–1.00)
GFR calc Af Amer: >60 mL/min (ref >60)
GFR calc non Af Amer: >60 mL/min (ref >60)
Glucose, Bld: 92 mg/dL (ref 65–99)
Potassium: 4 mmol/L (ref 3.5–5.1)
Sodium: 139 mmol/L (ref 135–145)

## 2017-05-25 LAB — SURGICAL PCR SCREEN
MRSA, PCR: NEGATIVE
Staphylococcus aureus: NEGATIVE

## 2017-05-25 LAB — CBC
HCT: 39.1 % (ref 36.0–46.0)
Hemoglobin: 12.8 g/dL (ref 12.0–15.0)
MCH: 30.8 pg (ref 26.0–34.0)
MCHC: 32.7 g/dL (ref 30.0–36.0)
MCV: 94.2 fL (ref 78.0–100.0)
Platelets: 201 10*3/uL (ref 150–400)
RBC: 4.15 MIL/uL (ref 3.87–5.11)
RDW: 14 % (ref 11.5–15.5)
WBC: 6.8 10*3/uL (ref 4.0–10.5)

## 2017-05-25 LAB — TYPE AND SCREEN
ABO/RH(D): AB POS
Antibody Screen: NEGATIVE

## 2017-05-25 LAB — ABO/RH: ABO/RH(D): AB POS

## 2017-05-25 SURGERY — POSTERIOR LUMBAR FUSION 1 LEVEL
Anesthesia: General | Site: Back

## 2017-05-25 MED ORDER — PROPOFOL 10 MG/ML IV BOLUS
INTRAVENOUS | Status: AC
  Filled 2017-05-25: qty 20

## 2017-05-25 MED ORDER — DULOXETINE HCL 60 MG PO CPEP
90.0000 mg | ORAL_CAPSULE | Freq: Every day | ORAL | Status: DC
  Administered 2017-05-26 – 2017-05-28 (×3): 90 mg via ORAL
  Filled 2017-05-25 (×3): qty 1

## 2017-05-25 MED ORDER — CEFAZOLIN SODIUM-DEXTROSE 2-4 GM/100ML-% IV SOLN
2.0000 g | Freq: Three times a day (TID) | INTRAVENOUS | Status: AC
  Administered 2017-05-26 (×2): 2 g via INTRAVENOUS
  Filled 2017-05-25 (×2): qty 100

## 2017-05-25 MED ORDER — MIDAZOLAM HCL 5 MG/5ML IJ SOLN
INTRAMUSCULAR | Status: DC | PRN
  Administered 2017-05-25: 2 mg via INTRAVENOUS

## 2017-05-25 MED ORDER — 0.9 % SODIUM CHLORIDE (POUR BTL) OPTIME
TOPICAL | Status: DC | PRN
  Administered 2017-05-25: 1000 mL

## 2017-05-25 MED ORDER — CLONAZEPAM 0.5 MG PO TABS
0.5000 mg | ORAL_TABLET | Freq: Every day | ORAL | Status: DC
  Administered 2017-05-26 – 2017-05-27 (×3): 0.5 mg via ORAL
  Filled 2017-05-25 (×3): qty 1

## 2017-05-25 MED ORDER — GABAPENTIN 100 MG PO CAPS
100.0000 mg | ORAL_CAPSULE | Freq: Every day | ORAL | Status: DC
  Administered 2017-05-26 – 2017-05-27 (×3): 100 mg via ORAL
  Filled 2017-05-25 (×3): qty 1

## 2017-05-25 MED ORDER — FENTANYL CITRATE (PF) 100 MCG/2ML IJ SOLN: 25.0000 ug | INTRAMUSCULAR | Status: DC | PRN

## 2017-05-25 MED ORDER — TRAZODONE HCL 100 MG PO TABS
100.0000 mg | ORAL_TABLET | Freq: Every day | ORAL | Status: DC
  Administered 2017-05-26 – 2017-05-27 (×3): 100 mg via ORAL
  Filled 2017-05-25 (×3): qty 1

## 2017-05-25 MED ORDER — THROMBIN 20000 UNITS EX SOLR
CUTANEOUS | Status: AC
  Filled 2017-05-25: qty 20000

## 2017-05-25 MED ORDER — DEXAMETHASONE SODIUM PHOSPHATE 10 MG/ML IJ SOLN: 10.0000 mg | INTRAMUSCULAR | Status: DC

## 2017-05-25 MED ORDER — MENTHOL 3 MG MT LOZG: 1.0000 | LOZENGE | OROMUCOSAL | Status: DC | PRN

## 2017-05-25 MED ORDER — PROPOFOL 10 MG/ML IV BOLUS
INTRAVENOUS | Status: DC | PRN
  Administered 2017-05-25: 140 mg via INTRAVENOUS

## 2017-05-25 MED ORDER — CALCIUM CARBONATE-VITAMIN D 500-200 MG-UNIT PO TABS
1.0000 | ORAL_TABLET | Freq: Every day | ORAL | Status: DC
  Administered 2017-05-26 – 2017-05-28 (×3): 1 via ORAL
  Filled 2017-05-25 (×3): qty 1

## 2017-05-25 MED ORDER — FENTANYL CITRATE (PF) 250 MCG/5ML IJ SOLN
INTRAMUSCULAR | Status: AC
  Filled 2017-05-25: qty 5

## 2017-05-25 MED ORDER — CHLORHEXIDINE GLUCONATE CLOTH 2 % EX PADS: 6.0000 | MEDICATED_PAD | Freq: Once | CUTANEOUS | Status: DC

## 2017-05-25 MED ORDER — ONDANSETRON HCL 4 MG PO TABS
4.0000 mg | ORAL_TABLET | Freq: Four times a day (QID) | ORAL | Status: DC | PRN
  Administered 2017-05-27: 4 mg via ORAL

## 2017-05-25 MED ORDER — PHENOL 1.4 % MT LIQD: 1.0000 | OROMUCOSAL | Status: DC | PRN

## 2017-05-25 MED ORDER — BUPIVACAINE LIPOSOME 1.3 % IJ SUSP
INTRAMUSCULAR | Status: DC | PRN
  Administered 2017-05-25: 20 mL

## 2017-05-25 MED ORDER — ACETAMINOPHEN 650 MG RE SUPP: 650.0000 mg | RECTAL | Status: DC | PRN

## 2017-05-25 MED ORDER — ONDANSETRON HCL 4 MG/2ML IJ SOLN
INTRAMUSCULAR | Status: DC | PRN
  Administered 2017-05-25: 4 mg via INTRAVENOUS

## 2017-05-25 MED ORDER — ROCURONIUM BROMIDE 50 MG/5ML IV SOLN
INTRAVENOUS | Status: AC
  Filled 2017-05-25: qty 1

## 2017-05-25 MED ORDER — CEFAZOLIN SODIUM-DEXTROSE 2-4 GM/100ML-% IV SOLN
INTRAVENOUS | Status: AC
  Filled 2017-05-25: qty 100

## 2017-05-25 MED ORDER — BENAZEPRIL HCL 20 MG PO TABS
20.0000 mg | ORAL_TABLET | Freq: Every day | ORAL | Status: DC
  Administered 2017-05-26 – 2017-05-28 (×3): 20 mg via ORAL
  Filled 2017-05-25 (×3): qty 1

## 2017-05-25 MED ORDER — ALUM & MAG HYDROXIDE-SIMETH 200-200-20 MG/5ML PO SUSP: 30.0000 mL | Freq: Four times a day (QID) | ORAL | Status: DC | PRN

## 2017-05-25 MED ORDER — PANTOPRAZOLE SODIUM 40 MG PO TBEC
40.0000 mg | DELAYED_RELEASE_TABLET | Freq: Every day | ORAL | Status: DC
  Administered 2017-05-26 – 2017-05-28 (×3): 40 mg via ORAL
  Filled 2017-05-25 (×3): qty 1

## 2017-05-25 MED ORDER — LACTATED RINGERS IV SOLN
INTRAVENOUS | Status: DC | PRN
  Administered 2017-05-25 (×3): via INTRAVENOUS

## 2017-05-25 MED ORDER — HYDROMORPHONE HCL 2 MG/ML IJ SOLN
0.2500 mg | INTRAMUSCULAR | Status: DC | PRN
  Administered 2017-05-25: 0.5 mg via INTRAVENOUS

## 2017-05-25 MED ORDER — SODIUM CHLORIDE 0.9 % IV SOLN
250.0000 mL | INTRAVENOUS | Status: DC
  Administered 2017-05-26: 250 mL via INTRAVENOUS

## 2017-05-25 MED ORDER — THROMBIN (RECOMBINANT) 5000 UNITS EX SOLR
OROMUCOSAL | Status: DC | PRN
  Administered 2017-05-25: 5 mL

## 2017-05-25 MED ORDER — DEXAMETHASONE SODIUM PHOSPHATE 10 MG/ML IJ SOLN
10.0000 mg | INTRAMUSCULAR | Status: AC
  Administered 2017-05-25: 10 mg via INTRAVENOUS

## 2017-05-25 MED ORDER — ACETAMINOPHEN 325 MG PO TABS
650.0000 mg | ORAL_TABLET | ORAL | Status: DC | PRN
  Administered 2017-05-28: 650 mg via ORAL
  Filled 2017-05-25: qty 2

## 2017-05-25 MED ORDER — CEFAZOLIN SODIUM-DEXTROSE 2-4 GM/100ML-% IV SOLN: 2.0000 g | INTRAVENOUS | Status: DC

## 2017-05-25 MED ORDER — ASPIRIN 81 MG PO CHEW
81.0000 mg | CHEWABLE_TABLET | Freq: Every day | ORAL | Status: DC
  Administered 2017-05-26 – 2017-05-28 (×3): 81 mg via ORAL
  Filled 2017-05-25 (×3): qty 1

## 2017-05-25 MED ORDER — CYCLOBENZAPRINE HCL 10 MG PO TABS
10.0000 mg | ORAL_TABLET | Freq: Three times a day (TID) | ORAL | Status: DC | PRN
  Administered 2017-05-27 – 2017-05-28 (×2): 10 mg via ORAL
  Filled 2017-05-25 (×2): qty 1

## 2017-05-25 MED ORDER — OXYCODONE HCL 5 MG PO TABS: 5.0000 mg | ORAL_TABLET | Freq: Once | ORAL | Status: DC | PRN

## 2017-05-25 MED ORDER — VALACYCLOVIR HCL 500 MG PO TABS
500.0000 mg | ORAL_TABLET | Freq: Every day | ORAL | Status: DC
  Administered 2017-05-26 – 2017-05-28 (×3): 500 mg via ORAL
  Filled 2017-05-25 (×3): qty 1

## 2017-05-25 MED ORDER — LIDOCAINE HCL (CARDIAC) PF 100 MG/5ML IV SOSY
PREFILLED_SYRINGE | INTRAVENOUS | Status: DC | PRN
  Administered 2017-05-25: 30 mg via INTRAVENOUS

## 2017-05-25 MED ORDER — CEFAZOLIN SODIUM-DEXTROSE 2-4 GM/100ML-% IV SOLN
2.0000 g | Freq: Once | INTRAVENOUS | Status: AC
  Administered 2017-05-25: 2 g via INTRAVENOUS

## 2017-05-25 MED ORDER — ATORVASTATIN CALCIUM 20 MG PO TABS
20.0000 mg | ORAL_TABLET | Freq: Every day | ORAL | Status: DC
  Administered 2017-05-26 – 2017-05-27 (×3): 20 mg via ORAL
  Filled 2017-05-25: qty 1
  Filled 2017-05-25: qty 2
  Filled 2017-05-25 (×2): qty 1
  Filled 2017-05-25 (×2): qty 2

## 2017-05-25 MED ORDER — SODIUM CHLORIDE 0.9% FLUSH: 3.0000 mL | INTRAVENOUS | Status: DC | PRN

## 2017-05-25 MED ORDER — FENTANYL CITRATE (PF) 100 MCG/2ML IJ SOLN
INTRAMUSCULAR | Status: DC | PRN
  Administered 2017-05-25: 100 ug via INTRAVENOUS
  Administered 2017-05-25 (×3): 50 ug via INTRAVENOUS

## 2017-05-25 MED ORDER — THROMBIN (RECOMBINANT) 20000 UNITS EX SOLR
CUTANEOUS | Status: DC | PRN
  Administered 2017-05-25: 20 mL via TOPICAL

## 2017-05-25 MED ORDER — SODIUM CHLORIDE 0.9% FLUSH
3.0000 mL | Freq: Two times a day (BID) | INTRAVENOUS | Status: DC
  Administered 2017-05-26 – 2017-05-27 (×5): 3 mL via INTRAVENOUS

## 2017-05-25 MED ORDER — SODIUM CHLORIDE 0.9 % IV SOLN
INTRAVENOUS | Status: DC | PRN
  Administered 2017-05-25: 500 mL

## 2017-05-25 MED ORDER — MIDAZOLAM HCL 2 MG/2ML IJ SOLN
INTRAMUSCULAR | Status: AC
  Filled 2017-05-25: qty 2

## 2017-05-25 MED ORDER — VANCOMYCIN HCL 1 G IV SOLR
INTRAVENOUS | Status: DC | PRN
  Administered 2017-05-25: 1000 mg

## 2017-05-25 MED ORDER — BUPIVACAINE HCL (PF) 0.25 % IJ SOLN
INTRAMUSCULAR | Status: AC
  Filled 2017-05-25: qty 30

## 2017-05-25 MED ORDER — HYDROMORPHONE HCL 1 MG/ML IJ SOLN: 1.0000 mg | INTRAMUSCULAR | Status: DC | PRN

## 2017-05-25 MED ORDER — THROMBIN 5000 UNITS EX SOLR
CUTANEOUS | Status: AC
  Filled 2017-05-25: qty 5000

## 2017-05-25 MED ORDER — OXYCODONE HCL 5 MG/5ML PO SOLN
5.0000 mg | Freq: Once | ORAL | Status: DC | PRN
Start: 2017-05-25 — End: 2017-05-25

## 2017-05-25 MED ORDER — ONDANSETRON HCL 4 MG/2ML IJ SOLN: 4.0000 mg | Freq: Four times a day (QID) | INTRAMUSCULAR | Status: DC | PRN

## 2017-05-25 MED ORDER — LIDOCAINE 2% (20 MG/ML) 5 ML SYRINGE
INTRAMUSCULAR | Status: AC
  Filled 2017-05-25: qty 5

## 2017-05-25 MED ORDER — BUPIVACAINE LIPOSOME 1.3 % IJ SUSP
20.0000 mL | Freq: Once | INTRAMUSCULAR | Status: DC
  Filled 2017-05-25: qty 20

## 2017-05-25 MED ORDER — SUGAMMADEX SODIUM 200 MG/2ML IV SOLN
INTRAVENOUS | Status: DC | PRN
  Administered 2017-05-25: 108.8 mg via INTRAVENOUS

## 2017-05-25 MED ORDER — OXYCODONE HCL 5 MG PO TABS
10.0000 mg | ORAL_TABLET | ORAL | Status: DC | PRN
  Administered 2017-05-26 – 2017-05-28 (×8): 10 mg via ORAL
  Filled 2017-05-25 (×9): qty 2

## 2017-05-25 MED ORDER — TURMERIC 500 MG PO CAPS: 500.0000 mg | ORAL_CAPSULE | Freq: Every day | ORAL | Status: DC

## 2017-05-25 MED ORDER — VANCOMYCIN HCL 1000 MG IV SOLR
INTRAVENOUS | Status: AC
  Filled 2017-05-25: qty 1000

## 2017-05-25 MED ORDER — BUPIVACAINE HCL (PF) 0.25 % IJ SOLN
INTRAMUSCULAR | Status: DC | PRN
  Administered 2017-05-25: 9 mL

## 2017-05-25 MED ORDER — ROCURONIUM BROMIDE 100 MG/10ML IV SOLN
INTRAVENOUS | Status: DC | PRN
  Administered 2017-05-25: 10 mg via INTRAVENOUS
  Administered 2017-05-25: 50 mg via INTRAVENOUS
  Administered 2017-05-25: 10 mg via INTRAVENOUS

## 2017-05-25 MED ORDER — HYDROMORPHONE HCL 2 MG/ML IJ SOLN
INTRAMUSCULAR | Status: AC
  Filled 2017-05-25: qty 1

## 2017-05-25 MED ORDER — ADULT MULTIVITAMIN LIQUID CH
15.0000 mL | Freq: Every day | ORAL | Status: DC
  Administered 2017-05-26 – 2017-05-27 (×2): 15 mL via ORAL
  Filled 2017-05-25 (×3): qty 15

## 2017-05-25 MED ORDER — FENTANYL CITRATE (PF) 100 MCG/2ML IJ SOLN
INTRAMUSCULAR | Status: AC
  Administered 2017-05-25: 50 ug
  Filled 2017-05-25: qty 2

## 2017-05-25 MED ORDER — TRAMADOL HCL 50 MG PO TABS
100.0000 mg | ORAL_TABLET | Freq: Four times a day (QID) | ORAL | Status: DC | PRN
  Administered 2017-05-27 – 2017-05-28 (×3): 100 mg via ORAL
  Filled 2017-05-25 (×3): qty 2

## 2017-05-25 MED ORDER — LACTATED RINGERS IV SOLN
INTRAVENOUS | Status: DC
  Administered 2017-05-25 – 2017-05-26 (×2): via INTRAVENOUS

## 2017-05-25 MED ORDER — B COMPLEX-C PO TABS
1.0000 | ORAL_TABLET | Freq: Every day | ORAL | Status: DC
  Administered 2017-05-26 – 2017-05-28 (×3): 1 via ORAL
  Filled 2017-05-25 (×3): qty 1

## 2017-05-25 SURGICAL SUPPLY — 84 items
BAG DECANTER FOR FLEXI CONT (MISCELLANEOUS) ×2 IMPLANT
BENZOIN TINCTURE PRP APPL 2/3 (GAUZE/BANDAGES/DRESSINGS) ×2 IMPLANT
BIT DRILL 5.0/4.0 (BIT) ×1 IMPLANT
BLADE CLIPPER SURG (BLADE) IMPLANT
BLADE SURG 11 STRL SS (BLADE) ×2 IMPLANT
BONE VIVIGEN FORMABLE 1.3CC (Bone Implant) ×2 IMPLANT
BONE VIVIGEN FORMABLE 5.4CC (Bone Implant) ×2 IMPLANT
BUR CUTTER 7.0 ROUND (BURR) ×2 IMPLANT
BUR MATCHSTICK NEURO 3.0 LAGG (BURR) ×2 IMPLANT
CANISTER SUCT 3000ML PPV (MISCELLANEOUS) ×2 IMPLANT
CAP LOCKING (Cap) ×4 IMPLANT
CAP LOCKING 5.5 CREO (Cap) ×4 IMPLANT
CARTRIDGE OIL MAESTRO DRILL (MISCELLANEOUS) ×1 IMPLANT
CLSR STERI-STRIP ANTIMIC 1/2X4 (GAUZE/BANDAGES/DRESSINGS) ×4 IMPLANT
CONT SPEC 4OZ CLIKSEAL STRL BL (MISCELLANEOUS) ×2 IMPLANT
COVER BACK TABLE 60X90IN (DRAPES) ×2 IMPLANT
DECANTER SPIKE VIAL GLASS SM (MISCELLANEOUS) ×2 IMPLANT
DERMABOND ADVANCED (GAUZE/BANDAGES/DRESSINGS) ×1
DERMABOND ADVANCED .7 DNX12 (GAUZE/BANDAGES/DRESSINGS) ×1 IMPLANT
DIFFUSER DRILL AIR PNEUMATIC (MISCELLANEOUS) ×2 IMPLANT
DRAPE C-ARM 42X72 X-RAY (DRAPES) ×4 IMPLANT
DRAPE C-ARMOR (DRAPES) IMPLANT
DRAPE HALF SHEET 40X57 (DRAPES) IMPLANT
DRAPE LAPAROTOMY 100X72X124 (DRAPES) ×2 IMPLANT
DRAPE SURG 17X23 STRL (DRAPES) ×2 IMPLANT
DRILL 5.0/4.0 (BIT) ×2
DRSG OPSITE 4X5.5 SM (GAUZE/BANDAGES/DRESSINGS) ×2 IMPLANT
DRSG OPSITE POSTOP 4X6 (GAUZE/BANDAGES/DRESSINGS) ×2 IMPLANT
DURAPREP 26ML APPLICATOR (WOUND CARE) ×2 IMPLANT
ELECT REM PT RETURN 9FT ADLT (ELECTROSURGICAL) ×2
ELECTRODE REM PT RTRN 9FT ADLT (ELECTROSURGICAL) ×1 IMPLANT
EVACUATOR 1/8 PVC DRAIN (DRAIN) ×2 IMPLANT
EVACUATOR 3/16  PVC DRAIN (DRAIN)
EVACUATOR 3/16 PVC DRAIN (DRAIN) IMPLANT
GAUZE SPONGE 4X4 12PLY STRL (GAUZE/BANDAGES/DRESSINGS) ×2 IMPLANT
GAUZE SPONGE 4X4 16PLY XRAY LF (GAUZE/BANDAGES/DRESSINGS) IMPLANT
GLOVE BIO SURGEON STRL SZ 6.5 (GLOVE) ×6 IMPLANT
GLOVE BIO SURGEON STRL SZ7 (GLOVE) ×4 IMPLANT
GLOVE BIO SURGEON STRL SZ7.5 (GLOVE) ×4 IMPLANT
GLOVE BIO SURGEON STRL SZ8 (GLOVE) ×4 IMPLANT
GLOVE BIOGEL PI IND STRL 6.5 (GLOVE) ×1 IMPLANT
GLOVE BIOGEL PI IND STRL 7.0 (GLOVE) IMPLANT
GLOVE BIOGEL PI IND STRL 7.5 (GLOVE) ×1 IMPLANT
GLOVE BIOGEL PI INDICATOR 6.5 (GLOVE) ×1
GLOVE BIOGEL PI INDICATOR 7.0 (GLOVE)
GLOVE BIOGEL PI INDICATOR 7.5 (GLOVE) ×1
GLOVE ECLIPSE 7.5 STRL STRAW (GLOVE) IMPLANT
GLOVE EXAM NITRILE LRG STRL (GLOVE) IMPLANT
GLOVE EXAM NITRILE XL STR (GLOVE) IMPLANT
GLOVE EXAM NITRILE XS STR PU (GLOVE) IMPLANT
GLOVE INDICATOR 8.5 STRL (GLOVE) ×4 IMPLANT
GOWN STRL REUS W/ TWL LRG LVL3 (GOWN DISPOSABLE) ×3 IMPLANT
GOWN STRL REUS W/ TWL XL LVL3 (GOWN DISPOSABLE) ×3 IMPLANT
GOWN STRL REUS W/TWL 2XL LVL3 (GOWN DISPOSABLE) IMPLANT
GOWN STRL REUS W/TWL LRG LVL3 (GOWN DISPOSABLE) ×3
GOWN STRL REUS W/TWL XL LVL3 (GOWN DISPOSABLE) ×3
HEMOSTAT POWDER KIT SURGIFOAM (HEMOSTASIS) IMPLANT
KIT BASIN OR (CUSTOM PROCEDURE TRAY) ×2 IMPLANT
KIT TURNOVER KIT B (KITS) ×2 IMPLANT
MILL MEDIUM DISP (BLADE) ×2 IMPLANT
NEEDLE HYPO 21X1.5 SAFETY (NEEDLE) ×2 IMPLANT
NEEDLE HYPO 25X1 1.5 SAFETY (NEEDLE) ×2 IMPLANT
NS IRRIG 1000ML POUR BTL (IV SOLUTION) ×2 IMPLANT
OIL CARTRIDGE MAESTRO DRILL (MISCELLANEOUS) ×2
PACK LAMINECTOMY NEURO (CUSTOM PROCEDURE TRAY) ×2 IMPLANT
PAD ARMBOARD 7.5X6 YLW CONV (MISCELLANEOUS) ×6 IMPLANT
ROD 40MM SPINAL (Rod) ×4 IMPLANT
SCREW MOD 6.0-5.0X35MM (Screw) ×4 IMPLANT
SHAFT CREO 30MM (Neuro Prosthesis/Implant) ×4 IMPLANT
SPACER RISE 10X22 8-14MM-10 (Spacer) ×4 IMPLANT
SPONGE LAP 4X18 X RAY DECT (DISPOSABLE) IMPLANT
SPONGE SURGIFOAM ABS GEL 100 (HEMOSTASIS) ×2 IMPLANT
STRIP CLOSURE SKIN 1/2X4 (GAUZE/BANDAGES/DRESSINGS) ×4 IMPLANT
SUT VIC AB 0 CT1 18XCR BRD8 (SUTURE) ×1 IMPLANT
SUT VIC AB 0 CT1 8-18 (SUTURE) ×1
SUT VIC AB 2-0 CT1 18 (SUTURE) ×4 IMPLANT
SUT VIC AB 4-0 PS2 27 (SUTURE) ×2 IMPLANT
SYR 20CC LL (SYRINGE) ×2 IMPLANT
TOWEL GREEN STERILE (TOWEL DISPOSABLE) ×2 IMPLANT
TOWEL GREEN STERILE FF (TOWEL DISPOSABLE) ×2 IMPLANT
TRAY FOLEY MTR SLVR 16FR STAT (SET/KITS/TRAYS/PACK) ×2 IMPLANT
TUBE CONNECTING 20X1/4 (TUBING) ×2 IMPLANT
TULIP CREP AMP 5.5MM (Orthopedic Implant) ×8 IMPLANT
WATER STERILE IRR 1000ML POUR (IV SOLUTION) ×2 IMPLANT

## 2017-05-25 NOTE — Transfer of Care (Signed)
Immediate Anesthesia Transfer of Care Note  Patient: Melody [name]  Procedure(s) Performed: POSTERIOR LUMBAR FUSION Lumbar four - five (N/A Back)  Patient Location: PACU  Anesthesia Type:General  Level of Consciousness: awake and alert   Airway & Oxygen Therapy: Patient Spontanous Breathing  Post-op Assessment: Report given to RN  Post vital signs: Reviewed and stable  Last Vitals:  Vitals Value Taken Time  BP 159/77 05/25/2017 10:30 PM  Temp    Pulse 83 05/25/2017 10:33 PM  Resp 18 05/25/2017 10:33 PM  SpO2 93 % 05/25/2017 10:33 PM  Vitals shown include unvalidated device data.  Last Pain:  Vitals:   05/25/17 1313  TempSrc:   PainSc: 5       Patients Stated Pain Goal: 7 (89/16/94 5038)  Complications: No apparent anesthesia complications

## 2017-05-25 NOTE — Anesthesia Preprocedure Evaluation (Signed)
Anesthesia Evaluation  Patient identified by MRN, date of birth, ID band Patient awake    Reviewed: Allergy & Precautions, NPO status , Patient's Chart, lab work & pertinent test results  History of Anesthesia Complications Negative for: history of anesthetic complications  Airway Mallampati: II  TM Distance: >3 FB Neck ROM: Full    Dental  (+) Teeth Intact   Pulmonary neg pulmonary ROS,    breath sounds clear to auscultation       Cardiovascular hypertension, Pt. on medications (-) angina(-) Past MI and (-) CHF  Rhythm:Regular     Neuro/Psych PSYCHIATRIC DISORDERS Depression negative neurological ROS     GI/Hepatic negative GI ROS, Neg liver ROS,   Endo/Other  negative endocrine ROS  Renal/GU negative Renal ROS     Musculoskeletal negative musculoskeletal ROS (+)   Abdominal   Peds  Hematology negative hematology ROS (+)   Anesthesia Other Findings   Reproductive/Obstetrics                             Anesthesia Physical Anesthesia Plan  ASA: II  Anesthesia Plan: General   Post-op Pain Management:    Induction: Intravenous  PONV Risk Score and Plan: 3 and Ondansetron and Dexamethasone  Airway Management Planned: Oral ETT  Additional Equipment: None  Intra-op Plan:   Post-operative Plan: Extubation in OR  Informed Consent: I have reviewed the patients History and Physical, chart, labs and discussed the procedure including the risks, benefits and alternatives for the proposed anesthesia with the patient or authorized representative who has indicated his/her understanding and acceptance.   Dental advisory given  Plan Discussed with: Surgeon and CRNA  Anesthesia Plan Comments:         Anesthesia Quick Evaluation

## 2017-05-25 NOTE — H&P (Signed)
Melody [name] is an 72 y.o. female.   Chief Complaint: back andbilateral leg pain HPI: 72 year old female with long-standing b bilateral leg pain refractory to all forms conservative treatment workup has revealed a grade 1 spondylolisthesis and large disc herniation at L4-5.   Due to patient's progression of  Clinical syndrome and failure of conservative treatment I have recommended posterior lumbar interbody fusion at L4-5. I've extensively gone over the risks and benefits of the operation with the patient as well as perioperative course expectations of outcome and alternatives surgery and he understands and agrees to proceed forward.  Past Medical History:  Diagnosis Date  . Depression   . Hypertension     Past Surgical History:  Procedure Laterality Date  . BREAST RECONSTRUCTION  1980   breast implant placement  . BREAST RECONSTRUCTION  1994   breast implant removal  . CESAREAN SECTION     x 2  . CHOLECYSTECTOMY    . COLONOSCOPY    . TONSILECTOMY, ADENOIDECTOMY, BILATERAL MYRINGOTOMY AND TUBES  1955    Family History  Problem Relation Age of Onset  . Heart disease Father    Social History:  reports that she has never smoked. She has never used smokeless tobacco. She reports that she does not drink alcohol or use drugs.  Allergies:  Allergies  Allergen Reactions  . Zolpidem Tartrate Other (See Comments)    Talks on phone and won't remember doing so, etc.    Medications Prior to Admission  Medication Sig Dispense Refill  . aspirin 81 MG tablet Take 81 mg by mouth daily.      Marland Kitchen atorvastatin (LIPITOR) 20 MG tablet Take 20 mg by mouth at bedtime.     Marland Kitchen b complex vitamins tablet Take 1 tablet by mouth daily.      . benazepril (LOTENSIN) 20 MG tablet Take 20 mg by mouth daily.    . Calcium Carb-Cholecalciferol (CALCIUM-VITAMIN D3) 600-400 MG-UNIT TABS Take 1 tablet by mouth daily.    . clonazePAM (KLONOPIN) 0.5 MG tablet Take 0.5 mg by mouth at bedtime.    . DULoxetine  (CYMBALTA) 30 MG capsule Take 90 mg by mouth daily.     Marland Kitchen gabapentin (NEURONTIN) 100 MG capsule Take 2 capsules (200 mg total) by mouth at bedtime. (Patient taking differently: Take 100 mg by mouth at bedtime. ) 60 capsule 3  . Multiple Vitamins-Minerals (MULTIVITAMIN PO) Take 1 tablet by mouth at bedtime.    . traMADol (ULTRAM) 50 MG tablet Take 100 mg by mouth every 6 (six) hours as needed. 1-2 tablets    . traZODone (DESYREL) 100 MG tablet Take 100 mg by mouth at bedtime.     . Turmeric 500 MG CAPS Take 500 mg by mouth daily.     . valACYclovir (VALTREX) 500 MG tablet Take 500 mg by mouth daily.    . meloxicam (MOBIC) 15 MG tablet Take 1 tablet (15 mg total) by mouth daily. (Patient not taking: Reported on 05/22/2017) 30 tablet 0    Results for orders placed or performed during the hospital encounter of 05/25/17 (from the past 48 hour(s))  Type and screen [place]     Status: None   Collection Time: 05/25/17  1:08 PM  Result Value Ref Range   ABO/RH(D) AB POS    Antibody Screen NEG    Sample Expiration      05/28/2017 Performed at [facility], [place] [address], [place], [place] [address]   ABO/Rh  Status: None (Preliminary result)   Collection Time: 05/25/17  1:08 PM  Result Value Ref Range   ABO/RH(D)      AB POS Performed at [facility], [place] [address] [place] [address]   CBC     Status: None   Collection Time: 05/25/17  1:09 PM  Result Value Ref Range   WBC 6.8 4.0 - 10.5 K/uL   RBC 4.15 3.87 - 5.11 MIL/uL   Hemoglobin 12.8 12.0 - 15.0 g/dL   HCT 39.1 36.0 - 46.0 %   MCV 94.2 78.0 - 100.0 fL   MCH 30.8 26.0 - 34.0 pg   MCHC 32.7 30.0 - 36.0 g/dL   RDW 14.0 11.5 - 15.5 %   Platelets 201 150 - 400 K/uL    Comment: Performed at [facility] [address], [place], [place] [address]  Basic metabolic panel     Status: None   Collection Time: 05/25/17  1:09 PM  Result Value Ref Range   Sodium 139 135 - 145 mmol/L    Potassium 4.0 3.5 - 5.1 mmol/L   Chloride 103 101 - 111 mmol/L   CO2 30 22 - 32 mmol/L   Glucose, Bld 92 65 - 99 mg/dL   BUN 16 6 - 20 mg/dL   Creatinine, Ser 0.84 0.44 - 1.00 mg/dL   Calcium 9.4 8.9 - 10.3 mg/dL   GFR calc non Af Amer >60 >60 mL/min   GFR calc Af Amer >60 >60 mL/min    Comment: (NOTE) The eGFR has been calculated using the CKD EPI equation. This calculation has not been validated in all clinical situations. eGFR's persistently <60 mL/min signify possible Chronic Kidney Disease.    Anion gap 6 5 - 15    Comment: Performed at [facility] [address] [place], [place] [address]  Surgical pcr screen     Status: None   Collection Time: 05/25/17  1:09 PM  Result Value Ref Range   MRSA, PCR NEGATIVE NEGATIVE   Staphylococcus aureus NEGATIVE NEGATIVE    Comment: (NOTE) The Xpert SA Assay (FDA approved for NASAL specimens in patients 39 years of age and older), is one component of a comprehensive surveillance program. It is not intended to diagnose infection nor to guide or monitor treatment. Performed at [facility], [place] [address], [place], [place] [address]    No results found.  Review of Systems  Musculoskeletal: Positive for back pain.  Neurological: Positive for sensory change and focal weakness.    Blood pressure (!) 163/85, pulse 73, temperature 97.6 F (36.4 C), temperature source Oral, resp. rate 18, height _0  (1.651 m), weight 54.4 kg (120 lb), SpO2 100 %. Physical Exam  Constitutional: She is oriented to person, place, and time. She appears well-developed.  Neck: Normal range of motion.  GI: Soft. Bowel sounds are normal.  Neurological: She is alert and oriented to person, place, and time. She has normal strength. GCS eye subscore is 4. GCS verbal subscore is 5. GCS motor subscore is 6.  Strength is 5 out of 5 iliopsoas, quads, hamstrings, gastric, into tibialis, and EHL  Skin: Skin is warm and dry.      Assessment/Plan 71 year old female presents for L4-5 decompression and fusion  Damean Poffenberger P, MD 05/25/2017, 3:08 PM

## 2017-05-25 NOTE — Anesthesia Procedure Notes (Signed)
Procedure Name: Intubation Date/Time: 05/25/2017 7:03 PM Performed by: Eligha Bridegroom, CRNA Pre-anesthesia Checklist: Patient identified, Emergency Drugs available, Suction available, Patient being monitored and Timeout performed Patient Re-evaluated:Patient Re-evaluated prior to induction Oxygen Delivery Method: Circle system utilized Preoxygenation: Pre-oxygenation with 100% oxygen Induction Type: IV induction Ventilation: Mask ventilation without difficulty and Oral airway inserted - appropriate to patient size Laryngoscope Size: Mac, Glidescope and 3 Grade View: Grade III Tube type: Oral Tube size: 7.0 mm Number of attempts: 2 Airway Equipment and Method: Stylet Placement Confirmation: ETT inserted through vocal cords under direct vision,  positive ETCO2 and breath sounds checked- equal and bilateral Secured at: 21 cm Tube secured with: Tape Dental Injury: Teeth and Oropharynx as per pre-operative assessment  Difficulty Due To: Difficult Airway- due to anterior larynx

## 2017-05-25 NOTE — Op Note (Signed)
Preoperative diagnosis: Grade 1 spondylolisthesis L4-5 with herniated nucleus pulposus L4-5 and right L4 and L5 and the clot with ease  Postoperative diagnosis: Same  Procedure: 1 Gill decompression L4-5 with complete facetectomies drilling off partial medial and inferior aspect of the pedicles at L4 with foraminotomies of the L4 and L5 nerve roots  #2 posterior lumbar interbody fusion L4-5 utilizing the globus expandable cage system packed with locally harvested autograft mixed with vivigen  #3 cortical screw fixation utilizing the globus Creole MCS cortical screw system  #40 reduction spinal deformity  Surgeon: Dominica Severin Daelynn Blower  Asst.: Glenford Peers  Anesthesia: Gen.  EBL: Minimal  History of present illness: 72 year old female who presented with rest worsening back pain and right leg pain in an L4 and L5 nerve pattern workup revealed large disc herniation with severe spinal stenosis at L4-5 and a grade 1 spinal listhesis. Due to patient's progression of clinical syndrome imaging findings and failure conservative treatment I recommended decompression stabilization procedure at L4-5. I extensively went over the risks and benefits of the operation with her as well as perioperative course expectations of outcome and alternatives of surgery and she understood and agreed to proceed forward.  Operative procedure: Patient was brought in the OR was induced under general anesthesia and positioned prone on the Wilson frame her back was prepped and draped in routine sterile fashion. After infiltration of 10 mL lidocaine with epi a midline incision was made and Bovie light cautery was used to take down the subcutaneous tissues and subperiosteal dissections carried out on the lamina of L4 and L5. Interoperative x-ray confirmed the location of the L3 pedicle attention was taken just inferiorly this and the facets were then drilled down I preserved the interspinous ligament and the spinous process but performed  complete laminectomies on both sides complete medial facetectomies then drilled down the medial inferior aspect pedicles at L4 to gain access and identify the L4 nerve root I aggressively under bit the superior articular facet at L5 is again axis the lateral aspect of the disc space. There was extensive granulation tissue causing severe compression of the L4 nerve root on the lateral aspect of thecal sac underneath the 4 root. This is all teased away to identify the disc space and decompress both before and the 5 roots. Aggressive foraminotomies were carried out on bilaterally at L5 as well. Then disc space was incised include disc space were cleaned out then sequential distraction achieved significant reduction of the deformity and opened up the foramina at L4-5 prior to reduction the right-sided L4 nerve root was markedly flattened following reduction the nerve root was widely decompressed. Then I selected 814 expandable cages inserted them bilaterally packing the local autograft mix centrally. Expanded both cages opened up the disc space. Then cortical screws were placed pilot holes were drilled cannulated probed cannulated again with tapped probed again and 6050 by 35 mm screws were placed at L4 and 6050 by 30 mm placed L5. 40 mm rods were selected evident was anchored in place I did do some compression of the L4 cortical screw to the L5. Was a go see her get meticulous hemostasis was maintained I identified the foramen confirmed patency no migration of graft material and packed some additional graft material lateral to the cages. Then central vancomycin powder in the room wound and injected exparel the fascia placed a medium Hemovac drain and closed the wound in layers with after Vicryl and a running 4 subcuticular Dermabond benzo and Steri-Strips and sterile  dressing was applied patient recovered in stable condition. At the end of case on sponge counts were correct.

## 2017-05-26 NOTE — Progress Notes (Signed)
Patient ID: Melody [name], female   DOB: 10-25-1945, 72 y.o.   MRN: [id number] Doing great, some soreness, good DF/ PF, eating well, miobilize

## 2017-05-26 NOTE — Progress Notes (Signed)
Orthopedic Tech Progress Note Patient Details:  Melody [name] [birth year] [id number]  Patient ID: Melody [name], female   DOB: 12-May-1945, 72 y.o.   MRN: [id number]   Melody [name] 05/26/2017, 10:19 AMCalled [employer] for lumbar corset.

## 2017-05-26 NOTE — Plan of Care (Signed)
Ms. Kneeland has ambulated in the hall twice today.  She has rested comfortably, aside from intermittent post-surgical pain.  She is tolerating a regular diet, is voiding normally and her dressings are clean/dry/intact.  Plan of care is to increase safe mobility and decrease post-surgical pain.

## 2017-05-26 NOTE — Evaluation (Signed)
Occupational Therapy Evaluation Patient Details Name: Melody [name] MRN: [id number] DOB: 08/30/1945 Today's Date: 05/26/2017    History of Present Illness Patient is a 72 y.o. F with no significant PMH s/p L4-5 decompression and posterior lumbar interbody fusion.   Clinical Impression   Pt admitted with the above diagnoses and presents with below problem list. Pt will benefit from continued acute OT to address the below listed deficits and maximize independence with basic ADLs prior to d/c home. PTA pt was independent with ADLs. Pt is currently supervision to min guard with ADLs and functional transfers/mobility. ADL strategies discussed for maintaining back precautions.       Follow Up Recommendations  Supervision - Intermittent    Equipment Recommendations  None recommended by OT(pt declined 3n1)    Recommendations for Other Services       Precautions / Restrictions Precautions Precautions: Back Precaution Booklet Issued: Yes (comment) Precaution Comments: Verbalized understanding and provided written handout Restrictions Weight Bearing Restrictions: No      Mobility Bed Mobility Overal bed mobility: Needs Assistance Bed Mobility: Rolling;Sit to Sidelying Rolling: Supervision       Sit to sidelying: Supervision General bed mobility comments: Instructed log roll technique. Increased time. No physical assist needed.   Transfers Overall transfer level: Modified independent Equipment used: None                  Balance Overall balance assessment: Needs assistance Sitting-balance support: Feet unsupported;No upper extremity supported Sitting balance-Leahy Scale: Normal     Standing balance support: No upper extremity supported;During functional activity Standing balance-Leahy Scale: Good Standing balance comment: Patient with mild unsteadiness requiring supervision during static standing balance assessment Single Leg Stance - Right Leg: 10 Single Leg  Stance - Left Leg: 10 Tandem Stance - Right Leg: 10   Rhomberg - Eyes Opened: 30                 ADL either performed or assessed with clinical judgement   ADL Overall ADL's : Needs assistance/impaired Eating/Feeding: Set up;Sitting   Grooming: Min guard;Standing   Upper Body Bathing: Set up;Sitting   Lower Body Bathing: Min guard;Sit to/from stand   Upper Body Dressing : Set up;Sitting   Lower Body Dressing: Min guard;Sit to/from stand   Toilet Transfer: Min guard;Ambulation   Toileting- Clothing Manipulation and Hygiene: Min guard;Sit to/from stand Toileting - Clothing Manipulation Details (indicate cue type and reason): discussed AE for pericare if needed Tub/ Shower Transfer: Walk-in shower;Min guard;Ambulation Tub/Shower Transfer Details (indicate cue type and reason): discussed strategies for safe completion of LB bathing while maintaining prescautions. Pt plans to have daughter with her/nearby during showering. Functional mobility during ADLs: Min guard General ADL Comments: Pt completed in-room functional mobility (from recliner to sink to bed). Educated on strategies for ADL completion with back precuations.      Vision         Perception     Praxis      Pertinent Vitals/Pain Pain Assessment: Faces Faces Pain Scale: Hurts little more Pain Location: 4/10 during bed mobility; surgical site Pain Descriptors / Indicators: Discomfort Pain Intervention(s): Monitored during session;Repositioned     Hand Dominance     Extremity/Trunk Assessment Upper Extremity Assessment Upper Extremity Assessment: Overall WFL for tasks assessed   Lower Extremity Assessment Lower Extremity Assessment: Defer to PT evaluation   Cervical / Trunk Assessment Cervical / Trunk Assessment: Normal   Communication Communication Communication: No difficulties   Cognition Arousal/Alertness: Awake/alert Behavior During  Therapy: WFL for tasks assessed/performed Overall  Cognitive Status: Within Functional Limits for tasks assessed                                     General Comments       Exercises     Shoulder Instructions      Home Living Family/patient expects to be discharged to:: Private residence Living Arrangements: Children( son who is 39 y.o.) Available Help at Discharge: Family;Available PRN/intermittently Type of Home: House Home Access: Stairs to enter [employer] of Steps: 5 Entrance Stairs-Rails: Right Home Layout: Able to live on main level with bedroom/bathroom     Bathroom Shower/Tub: Tub/shower unit         Home Equipment: None          Prior Functioning/Environment Level of Independence: Independent        Comments: Retired but takes care of 40.56 year old grandchild 5 days a week. 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[employer]."         OT Problem List: Impaired balance (sitting and/or standing);Decreased knowledge of use of DME or AE;Decreased knowledge of precautions;Pain      OT Treatment/Interventions: Self-care/ADL training;DME and/or AE instruction;Therapeutic activities;Patient/family education;Balance training    OT Goals(Current goals can be found in the care plan section) Acute Rehab OT Goals Patient Stated Goal: return to working out at gym OT Goal Formulation: With patient Time For Goal Achievement: 06/02/17 Potential to Achieve Goals: Good ADL Goals Pt Will Perform Grooming: with modified independence;standing Pt Will Perform Lower Body Bathing: with modified independence;sit to/from stand Pt Will Perform Lower Body Dressing: with modified independence;sit to/from stand Pt Will Transfer to Toilet: with modified independence;ambulating Pt Will Perform Toileting - Clothing Manipulation and hygiene: with modified independence;sit to/from stand Pt Will Perform Tub/Shower Transfer: with supervision;ambulating  OT Frequency: Min 2X/week   Barriers to D/C:             Co-evaluation              AM-PAC PT "6 Clicks" Daily Activity     Outcome Measure Help from another person eating meals?: None Help from another person taking care of personal grooming?: None Help from another person toileting, which includes using toliet, bedpan, or urinal?: A Little Help from another person bathing (including washing, rinsing, drying)?: A Little Help from another person to put on and taking off regular upper body clothing?: None Help from another person to put on and taking off regular lower body clothing?: A Little 6 Click Score: 21   End of Session    Activity Tolerance: Patient tolerated treatment well Patient left: in bed;with call bell/phone within reach  OT Visit Diagnosis: Unsteadiness on feet (R26.81);Pain                Time: 7353-2992 OT Time Calculation (min): 19 min Charges:  OT General Charges $OT Visit: 1 Visit OT Evaluation $OT Eval Low Complexity: 1 Low G-Codes:       Hortencia Pilar 05/26/2017, 10:46 AM

## 2017-05-26 NOTE — Evaluation (Signed)
Physical Therapy Evaluation Patient Details Name: Melody [name] MRN: [id number] DOB: 1945-04-24 Today's Date: 05/26/2017   History of Present Illness  Patient is a 72 y.o. F with no significant PMH s/p L4-5 decompression and posterior lumbar interbody fusion.  Clinical Impression  Patient is s/p above surgery resulting in the deficits listed below (see PT Problem List). Patient displaying good pain control and able to ambulate 200 feet with no device and mild balance deviations noted. Patient states her balance is "off" since the initiation of back pain. Able to negotiate 5 steps with a right railing to facilitate safe discharge home. Recommending continued OPPT to address balance deficits and maximize functional mobility. No further acute PT needs identified. All education has been completed and the patient has no further questions. PT is signing off. Thank you for this referral.      Follow Up Recommendations Outpatient PT    Equipment Recommendations  None recommended by PT    Recommendations for Other Services       Precautions / Restrictions Precautions Precautions: Back Precaution Booklet Issued: Yes (comment) Precaution Comments: Verbalized understanding and provided written handout Restrictions Weight Bearing Restrictions: No      Mobility  Bed Mobility Overal bed mobility: Modified Independent             General bed mobility comments: instructions for log roll technique. Patient requiring increased time  Transfers Overall transfer level: Modified independent Equipment used: None                Ambulation/Gait Ambulation/Gait assistance: Modified independent (Device/Increase time) Ambulation Distance (Feet): 200 Feet Assistive device: None Gait Pattern/deviations: Step-through pattern;Decreased stride length Gait velocity: decreased   General Gait Details: Patient with guarded, but steady, gait. VC's for reciprocal arm swing  Stairs Stairs:  Yes Stairs assistance: Modified independent (Device/Increase time) Stair Management: One rail Right Number of Stairs: 5 General stair comments: modified independent with stair negotiation  Wheelchair Mobility    Modified Rankin (Stroke Patients Only)       Balance Overall balance assessment: Needs assistance Sitting-balance support: Feet unsupported;No upper extremity supported Sitting balance-Leahy Scale: Normal     Standing balance support: No upper extremity supported;During functional activity Standing balance-Leahy Scale: Good Standing balance comment: Patient with mild unsteadiness requiring supervision during static standing balance assessment Single Leg Stance - Right Leg: 10 Single Leg Stance - Left Leg: 10 Tandem Stance - Right Leg: 10   Rhomberg - Eyes Opened: 30                   Pertinent Vitals/Pain Pain Assessment: Faces Faces Pain Scale: Hurts a little bit Pain Location: surgical site Pain Descriptors / Indicators: Discomfort Pain Intervention(s): Monitored during session    Home Living Family/patient expects to be discharged to:: Private residence Living Arrangements: Children(son who is 103 y.o.) Available Help at Discharge: Family;Available PRN/intermittently Type of Home: House Home Access: Stairs to enter Entrance Stairs-Rails: Right Entrance Stairs-Number of Steps: 5 Home Layout: Able to live on main level with bedroom/bathroom Home Equipment: None      Prior Function Level of Independence: Independent         Comments: Retired but takes care of 15.29 year old grandchild 5 days a week. 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[employer]."      [employer]        Extremity/Trunk Assessment   Upper Extremity Assessment Upper Extremity Assessment: Overall WFL for tasks assessed  Lower Extremity Assessment Lower Extremity Assessment: Overall WFL for tasks assessed    Cervical / Trunk Assessment Cervical / Trunk  Assessment: Normal  Communication   Communication: No difficulties  Cognition Arousal/Alertness: Awake/alert Behavior During Therapy: WFL for tasks assessed/performed Overall Cognitive Status: Within Functional Limits for tasks assessed                                        General Comments      Exercises     Assessment/Plan    PT Assessment Patent does not need any further PT services  PT Problem List Decreased balance;Decreased mobility       PT Treatment Interventions      PT Goals (Current goals can be found in the Care Plan section)  Acute Rehab PT Goals Patient Stated Goal: return to working out at gym PT Goal Formulation: All assessment and education complete, DC therapy Potential to Achieve Goals: Good    Frequency     Barriers to discharge        Co-evaluation               AM-PAC PT "6 Clicks" Daily Activity  Outcome Measure Difficulty turning over in bed (including adjusting bedclothes, sheets and blankets)?: A Little Difficulty moving from lying on back to sitting on the side of the bed? : A Little Difficulty sitting down on and standing up from a chair with arms (e.g., wheelchair, bedside commode, etc,.)?: None Help needed moving to and from a bed to chair (including a wheelchair)?: None Help needed walking in hospital room?: None Help needed climbing 3-5 steps with a railing? : None 6 Click Score: 22    End of Session Equipment Utilized During Treatment: Gait belt Activity Tolerance: Patient tolerated treatment well Patient left: in chair;with call bell/phone within reach;with nursing/sitter in room Nurse Communication: Mobility status PT Visit Diagnosis: Unsteadiness on feet (R26.81);Pain Pain - part of body: (back)    Time: 3662-9476 PT Time Calculation (min) (ACUTE ONLY): 26 min   Charges:   PT Evaluation $PT Eval Low Complexity: 1 Low PT Treatments $Gait Training: 8-22 mins   PT G Codes:        Ellamae Sia, PT, DPT Acute Rehabilitation Services  Pager: [place] 05/26/2017, 8:57 AM

## 2017-05-27 ENCOUNTER — Other Ambulatory Visit: Payer: Self-pay

## 2017-05-27 NOTE — Plan of Care (Signed)
Melody [name] in in considerably more pain today than yesterday.  She is compliant with her brace and calling for help, but has required more pain interventions than yesterday.  Her dressings are clean, dry and intact, and hemovac drainage is decreasing.  Plan of care is to continue to support the patient's recovery through pain intervention, progressive mobilization and proper precautions for ambulation.

## 2017-05-27 NOTE — Anesthesia Postprocedure Evaluation (Signed)
Anesthesia Post Note  Patient: Melody [name]  Procedure(s) Performed: POSTERIOR LUMBAR FUSION Lumbar four - five (N/A Back)     Patient location during evaluation: PACU Anesthesia Type: General Level of consciousness: awake and alert Pain management: pain level controlled Vital Signs Assessment: post-procedure vital signs reviewed and stable Respiratory status: spontaneous breathing, nonlabored ventilation and respiratory function stable Cardiovascular status: blood pressure returned to baseline and stable Postop Assessment: no apparent nausea or vomiting Anesthetic complications: no    Last Vitals:  Vitals:   05/26/17 2355 05/27/17 0341  BP: (!) 120/52 (!) 103/56  Pulse: 82 82  Resp: 18 19  Temp: 36.8 C 36.9 C  SpO2: 94% 94%    Last Pain:  Vitals:   05/27/17 0624  TempSrc:   PainSc: 6                  Desia Saban,W. EDMOND

## 2017-05-27 NOTE — Progress Notes (Signed)
Subjective: Patient reports moderate back pain but denies any leg pain. Feels her legs are weak at times when trying to get up  Objective: Vital signs in last 24 hours: Temp:  [97.5 F (36.4 C)-98.6 F (37 C)] 98.5 F (36.9 C) (05/19 0341) Pulse Rate:  [82-84] 82 (05/19 0341) Resp:  [16-20] 19 (05/19 0341) BP: (89-120)/(49-64) 103/56 (05/19 0341) SpO2:  [94 %-99 %] 94 % (05/19 0341)  Intake/Output from previous day: 05/18 0701 - 05/19 0700 In: 1381.7 [P.O.:840; I.V.:541.7] Out: 151 [Urine:1; Drains:150] Intake/Output this shift: No intake/output data recorded.  Neurologic: Grossly normal  Lab Results: Lab Results  Component Value Date   WBC 6.8 05/25/2017   HGB 12.8 05/25/2017   HCT 39.1 05/25/2017   MCV 94.2 05/25/2017   PLT 201 05/25/2017   No results found for: INR, PROTIME BMET Lab Results  Component Value Date   NA 139 05/25/2017   K 4.0 05/25/2017   CL 103 05/25/2017   CO2 30 05/25/2017   GLUCOSE 92 05/25/2017   BUN 16 05/25/2017   CREATININE 0.84 05/25/2017   CALCIUM 9.4 05/25/2017    Studies/Results: Dg Lumbar Spine 2-3 Views  Result Date: 05/26/2017 CLINICAL DATA:  L4-5 PLIF EXAM: LUMBAR SPINE - 2-3 VIEW; DG C-ARM 61-120 MIN COMPARISON:  02/14/2017 lumbar spine MRI FINDINGS: 42 seconds of fluoroscopic time was utilized for PLIF procedure at L4-5 with hardware in place. No immediate intraoperative complications identified on this two-view study provided. IMPRESSION: L4-5 PLIF without immediate intraoperative complications identified. Electronically Signed   By: Ashley Royalty M.D.   On: 05/26/2017 00:05   Dg C-arm 1-60 Min  Result Date: 05/26/2017 CLINICAL DATA:  L4-5 PLIF EXAM: LUMBAR SPINE - 2-3 VIEW; DG C-ARM 61-120 MIN COMPARISON:  02/14/2017 lumbar spine MRI FINDINGS: 42 seconds of fluoroscopic time was utilized for PLIF procedure at L4-5 with hardware in place. No immediate intraoperative complications identified on this two-view study provided.  IMPRESSION: L4-5 PLIF without immediate intraoperative complications identified. Electronically Signed   By: Ashley Royalty M.D.   On: 05/26/2017 00:05   Dg C-arm 1-60 Min  Result Date: 05/26/2017 CLINICAL DATA:  L4-5 PLIF EXAM: LUMBAR SPINE - 2-3 VIEW; DG C-ARM 61-120 MIN COMPARISON:  02/14/2017 lumbar spine MRI FINDINGS: 42 seconds of fluoroscopic time was utilized for PLIF procedure at L4-5 with hardware in place. No immediate intraoperative complications identified on this two-view study provided. IMPRESSION: L4-5 PLIF without immediate intraoperative complications identified. Electronically Signed   By: Ashley Royalty M.D.   On: 05/26/2017 00:05    Assessment/Plan: Postop day 2 L4-5 PLIF. Does not feel she is strong enough to go home today. Will continue to work with PT/OT.    LOS: 2 days    Melody [name] [facility] 05/27/2017, 8:45 AM

## 2017-05-28 NOTE — Discharge Instructions (Signed)
No Bending, arching or twisting. No lifting greater than 10 pounds. No sitting in a straight back chair longer than 20-30 minutes at the time Use brace when out of bed Walk 4-5 times a day. Call [phone] to schedule a follow-up appointment and ask about dressing and showers.

## 2017-05-28 NOTE — Progress Notes (Signed)
Pt discharged at this time.  Has all belongings including glasses and cell phone.  Pt verbalizes understanding of all discharge instructions, including follow-up appointment.  Has brace on.  Pt states she has Ultram at home. Instructed to call if pain not controlled. No prescription given.  Home via car with daughter.

## 2017-05-28 NOTE — Consult Note (Signed)
[facility] CM Primary Care Navigator  05/28/2017  Melody [name] [birth year] [id number]   Met withpatient and daughter Melody [name]) at the bedsideto identify possible discharge needs. Patientreports having"persistent pain to right thigh radiating to right knee and ankle" thathadled to this admission/ surgery.(underwent decompression stabilization procedure at L4-L5)  PatientendorsesDr.Cynthia White with [facility] care provider.   Patientshared using [place] to obtainmedications withoutdifficulty.   Patientstatesmanagingher ownmedications at home using "pill box" system filled monthly.  Patient reports thatshewas driving prior to admission/ surgery, but daughter or son (Melody [name])willbeproviding transportationtoher doctors'appointmentsafter discharge.  Patient's son liveswith her. Patient's daughter and son will serve as her primary caregiver at home upon discharge.  Anticipatedplan for dischargeishomeaccording topatient.  Patient voiced understanding to call primary care provider's office whenshereturns home,for a post discharge follow-up appointment within1- 2 weeksor sooner if needed.Patient letter (with PCP's contact number) was provided asareminder.  Explained topatientand daughter regardingTHN CM services available for health managementat homebutshedeniesanyneeds or concernsat this point. Patientand daughter expressedunderstandingto seek referral from primary care provider to [facility] care management ifnecessaryand deemed appropriate for anyservices in the future.  [facility] care management information provided for future needs thatpatientmay have.  Patienthowever,verbally agreedand optedforEMMIcalls tofollowup herrecoveryat home.   Referral made for [facility] General calls after discharge.   For additional questions please  contact:  Edwena Felty A. Danel Studzinski, BSN, RN-BC [facility] PRIMARY CARE Navigator Cell: [phone]

## 2017-05-28 NOTE — Discharge Summary (Signed)
  Physician Discharge Summary  Patient ID: Melody [name] MRN: [id number] DOB/AGE: 14-Mar-1945 72 y.o. Estimated body mass index is 21.09 kg/m as calculated from the following:   Height as of this encounter: 5\' 5"  (1.651 m).   Weight as of this encounter: 57.5 kg (126 lb 12.2 oz).   Admit date: 05/25/2017 Discharge date: 05/28/2017  Admission Diagnoses:spondylolisthesis L4-5  Discharge Diagnoses: same Active Problems:   Spondylolisthesis at L4-L5 level   Discharged Condition: good  Hospital Course: patient admitted hospital underwent decompression stabilization procedure at L4-5. Postoperatively patient did very well went covered in the floor on the floor was angling and voiding spontaneously tolerating regular diet stable for discharge home. Patient was cleared with physical and occupational therapy no equipment or home health therapy is needed.  Consults: Significant Diagnostic Studies: Treatments:L4-5 decompression stabilization Discharge Exam: Blood pressure (!) 104/54, pulse 89, temperature 98 F (36.7 C), temperature source Oral, resp. rate 18, height 5\' 5"  (1.651 m), weight 57.5 kg (126 lb 12.2 oz), SpO2 97 %. Strength out of 5 wound clean dry and intact  Disposition: home   Allergies as of 05/28/2017      Reactions   Zolpidem Tartrate Other (See Comments)   Talks on phone and won't remember doing so, etc.      Medication List    STOP taking these medications   meloxicam 15 MG tablet Commonly known as:  MOBIC     TAKE these medications   aspirin 81 MG tablet Take 81 mg by mouth daily.   atorvastatin 20 MG tablet Commonly known as:  LIPITOR Take 20 mg by mouth at bedtime.   b complex vitamins tablet Take 1 tablet by mouth daily.   benazepril 20 MG tablet Commonly known as:  LOTENSIN Take 20 mg by mouth daily.   Calcium-Vitamin D3 600-400 MG-UNIT Tabs Take 1 tablet by mouth daily.   clonazePAM 0.5 MG tablet Commonly known as:  KLONOPIN Take 0.5 mg  by mouth at bedtime.   DULoxetine 30 MG capsule Commonly known as:  CYMBALTA Take 90 mg by mouth daily.   gabapentin 100 MG capsule Commonly known as:  NEURONTIN Take 2 capsules (200 mg total) by mouth at bedtime. What changed:  how much to take   MULTIVITAMIN PO Take 1 tablet by mouth at bedtime.   traMADol 50 MG tablet Commonly known as:  ULTRAM Take 100 mg by mouth every 6 (six) hours as needed. 1-2 tablets   traZODone 100 MG tablet Commonly known as:  DESYREL Take 100 mg by mouth at bedtime.   Turmeric 500 MG Caps Take 500 mg by mouth daily.   valACYclovir 500 MG tablet Commonly known as:  VALTREX Take 500 mg by mouth daily.        Signed: Leighanna Kirn P 05/28/2017, 8:57 AM

## 2017-05-28 NOTE — Progress Notes (Signed)
Occupational Therapy Treatment Patient Details Name: Melody [name] MRN: [id number] DOB: July 06, 1945 Today's Date: 05/28/2017    History of present illness Patient is a 72 y.o. F with no significant PMH s/p L4-5 decompression and posterior lumbar interbody fusion.   OT comments  Pt progressing towards OT goals, presents supine in bed pleasant and willing to participate in therapy session. Pt requiring ModA for bed mobility, close minguard for room level mobility this session without AD, completing toileting and standing grooming ADLs with minguard assist. Reviewed and further educated on back precautions, safety and compensatory techniques for completing ADLs and functional transfers while adhering to back precautions with pt verbalizing understanding. Pt's daughter present during session and verbalizing understanding of education as well, reports she or pt's son will be providing assist initially after discharge for ADLs PRN. Questions answered throughout. Pt anticipating d/c home later today.    Follow Up Recommendations  Supervision/Assistance - 24 hour(24hr initially )    Equipment Recommendations  None recommended by OT(pt declining 3:1 at this time )          Precautions / Restrictions Precautions Precautions: Back Precaution Comments: pt verbalizes 3/3 back precautions Restrictions Weight Bearing Restrictions: No       Mobility Bed Mobility Overal bed mobility: Needs Assistance Bed Mobility: Rolling;Sit to Sidelying;Sidelying to Sit Rolling: Supervision Sidelying to sit: Mod assist     Sit to sidelying: Mod assist General bed mobility comments: verbal cues for proper log roll technique, completing from flat bed without handrail to facilitate home setup; assist to bring trunk upright to sitting; assist for advancing LEs onto EOB when returning to supine; daughter present for education as well   Transfers Overall transfer level: Needs assistance Equipment used:  None Transfers: Sit to/from Stand Sit to Stand: Min guard         General transfer comment: minguard for safety; increased time/effort     Balance Overall balance assessment: Needs assistance Sitting-balance support: Feet unsupported;No upper extremity supported Sitting balance-Leahy Scale: Good     Standing balance support: No upper extremity supported;During functional activity Standing balance-Leahy Scale: Fair Standing balance comment: Patient with mild unsteadiness requiring supervision during static standing; pt often seeking UE support during room level mobility                            ADL either performed or assessed with clinical judgement   ADL Overall ADL's : Needs assistance/impaired     Grooming: Min guard;Standing;Wash/dry hands           Upper Body Dressing : Set up;Minimal assistance;Sitting Upper Body Dressing Details (indicate cue type and reason): setup assist for donning lumbar corsett, daughter present and assisting as well, verbal cues for technique      Toilet Transfer: Minimal assistance;Ambulation;Regular Museum/gallery exhibitions officer and Hygiene: Min guard;Sit to/from Nurse, children's Details (indicate cue type and reason): educated on use of shower chair for increased safety and adherence to back precautions during task completion with pt/pt's daughter verbalizing understanding; educated to have family member present during shower transfer and during task completion for increased safety  Functional mobility during ADLs: Min guard;Minimal assistance General ADL Comments: pt completing room level functional mobility, toileting and standing grooming ADLs; reviewed and further educated on back precautions, safety and compensatory techniques for completing ADLs while adhering to precautions; educated on option of using AE during ADLs, pt declining at this time  stating she will have family assist                         Cognition Arousal/Alertness: Awake/alert Behavior During Therapy: WFL for tasks assessed/performed Overall Cognitive Status: Within Functional Limits for tasks assessed                                                      General Comments pt's daughter present during session     Pertinent Vitals/ Pain       Pain Assessment: Faces Faces Pain Scale: Hurts little more Pain Location: surgical site at back  Pain Descriptors / Indicators: Discomfort;Guarding Pain Intervention(s): Monitored during session;Repositioned                                                          Frequency  Min 2X/week        Progress Toward Goals  OT Goals(current goals can now be found in the care plan section)  Progress towards OT goals: Progressing toward goals  Acute Rehab OT Goals Patient Stated Goal: return to working out at gym OT Goal Formulation: With patient Time For Goal Achievement: 06/02/17 Potential to Achieve Goals: Good  Plan Discharge plan remains appropriate                     AM-PAC PT "6 Clicks" Daily Activity     Outcome Measure   Help from another person eating meals?: None Help from another person taking care of personal grooming?: None Help from another person toileting, which includes using toliet, bedpan, or urinal?: A Little Help from another person bathing (including washing, rinsing, drying)?: A Little Help from another person to put on and taking off regular upper body clothing?: A Little Help from another person to put on and taking off regular lower body clothing?: A Lot 6 Click Score: 19    End of Session Equipment Utilized During Treatment: Back brace  OT Visit Diagnosis: Unsteadiness on feet (R26.81);Pain Pain - part of body: (back )   Activity Tolerance Patient tolerated treatment well   Patient Left in bed;with call bell/phone within reach;with family/visitor present   Nurse  Communication Mobility status        Time: 1205-1226 OT Time Calculation (min): 21 min  Charges: OT General Charges $OT Visit: 1 Visit OT Treatments $Self Care/Home Management : 8-22 mins  Lou Cal, OT Pager [phone] 05/28/2017    [name] 05/28/2017, 1:41 PM

## 2017-05-28 NOTE — Care Management Note (Signed)
Case Management Note  Patient Details  Name: CAMBREY LUPI MRN: [id number] Date of Birth: June 28, 1945  Subjective/Objective:     Pt s/p lumbar surgery. She is from home with her son.                Action/Plan: PT recommending outpatient therapy. Pt will f/u with MD at office prior to this being arranged. No DME needs.  Pt has PCP, insurance and transportation home.   Expected Discharge Date:  05/28/17               Expected Discharge Plan:  Home/Self Care  In-House Referral:     Discharge planning Services     Post Acute Care Choice:    Choice offered to:     DME Arranged:    DME Agency:     HH Arranged:    HH Agency:     Status of Service:  Completed, signed off  If discussed at [employer] of Stay Meetings, dates discussed:    Additional Comments:  Pollie Friar, RN 05/28/2017, 11:06 AM

## 2017-06-03 ENCOUNTER — Other Ambulatory Visit: Payer: Self-pay | Admitting: Family Medicine

## 2017-06-05 NOTE — Telephone Encounter (Signed)
Refill done.  

## 2017-06-12 ENCOUNTER — Ambulatory Visit: Payer: PPO | Admitting: Family Medicine

## 2017-06-20 NOTE — Progress Notes (Signed)
[facility] [address] [place], [place] [address] Phone: [phone] Subjective:       CC: Back pain follow-up  OVF:[id number]  Melody [name] is a 72 y.o. female coming in with complaint of back pain. Her pain is less than 1%. She had surgery May 17th. She is very happy with the results and would like to see next step. She has not begun physical therapy yet but may after 3 weeks from today when she follows up with her surgeon. Is wearing bracing for support.  The surgery patient was under was a posterior interbody fusion of L4-L5.  Patient has not started doing conservative therapy.  Continues to wear the back brace.      Past Medical History:  Diagnosis Date  . Depression   . Hypertension    Past Surgical History:  Procedure Laterality Date  . BREAST RECONSTRUCTION  1980   breast implant placement  . BREAST RECONSTRUCTION  1994   breast implant removal  . CESAREAN SECTION     x 2  . CHOLECYSTECTOMY    . COLONOSCOPY    . TONSILECTOMY, ADENOIDECTOMY, BILATERAL MYRINGOTOMY AND TUBES  1955   Social History   Socioeconomic History  . Marital status: Divorced    Spouse name: Not on file  . Number of children: Not on file  . Years of education: Not on file  . Highest education level: Not on file  Occupational History  . Not on file  Social Needs  . Financial resource strain: Not on file  . Food insecurity:    Worry: Not on file    Inability: Not on file  . Transportation needs:    Medical: Not on file    Non-medical: Not on file  Tobacco Use  . Smoking status: Never Smoker  . Smokeless tobacco: Never Used  Substance and Sexual Activity  . Alcohol use: No  . Drug use: No  . Sexual activity: Not on file  Lifestyle  . Physical activity:    Days per week: Not on file    Minutes per session: Not on file  . Stress: Not on file  Relationships  . Social connections:    Talks on phone: Not on file    Gets together: Not on file   Attends religious service: Not on file    Active member of club or organization: Not on file    Attends meetings of clubs or organizations: Not on file    Relationship status: Not on file  Other Topics Concern  . Not on file  Social History Narrative  . Not on file   Allergies  Allergen Reactions  . Zolpidem Tartrate Other (See Comments)    Talks on phone and won't remember doing so, etc.   Family History  Problem Relation Age of Onset  . Heart disease Father      Past medical history, social, surgical and family history all reviewed in electronic medical record.  No pertanent information unless stated regarding to the chief complaint.   Review of Systems:Review of systems updated and as accurate as of 06/20/17  No headache, visual changes, nausea, vomiting, diarrhea, constipation, dizziness, abdominal pain, skin rash, fevers, chills, night sweats, weight loss, swollen lymph nodes, body aches, joint swelling, muscle aches, chest pain, shortness of breath, mood changes.   Objective  There were no vitals taken for this visit. Systems examined below as of 06/20/17   General: No apparent distress alert and oriented x3  mood and affect normal, dressed appropriately.  HEENT: Pupils equal, extraocular movements intact  Respiratory: Patient's speak in full sentences and does not appear short of breath  Cardiovascular: No lower extremity edema, non tender, no erythema  Skin: Warm dry intact with no signs of infection or rash on extremities or on axial skeleton.  Abdomen: Soft nontender  Neuro: Cranial nerves II through XII are intact, neurovascularly intact in all extremities with 2+ DTRs and 2+ pulses.  Lymph: No lymphadenopathy of posterior or anterior cervical chain or axillae bilaterally.  Gait normal with good balance and coordination.  MSK:  Non tender with full range of motion and good stability and symmetric strength and tone of shoulders, elbows, wrist, hip, knee and ankles  bilaterally.  Mild arthritic changes of multiple joints Back exam shows the patient is wearing a back brace.  Does have loss of lordosis.  Negative straight leg test bilaterally.  No weakness noted.  Neurovascularly intact and symmetric      Impression and Recommendations:     This case required medical decision making of moderate complexity.      Note: This dictation was prepared with Dragon dictation along with smaller phrase technology. Any transcriptional errors that result from this process are unintentional.

## 2017-06-21 ENCOUNTER — Ambulatory Visit (INDEPENDENT_AMBULATORY_CARE_PROVIDER_SITE_OTHER): Payer: PPO | Admitting: Family Medicine

## 2017-06-21 ENCOUNTER — Encounter: Payer: Self-pay | Admitting: Family Medicine

## 2017-06-21 DIAGNOSIS — M5416 Radiculopathy, lumbar region: Secondary | ICD-10-CM

## 2017-06-21 DIAGNOSIS — M48061 Spinal stenosis, lumbar region without neurogenic claudication: Secondary | ICD-10-CM | POA: Diagnosis not present

## 2017-06-21 MED ORDER — VITAMIN D (ERGOCALCIFEROL) 1.25 MG (50000 UNIT) PO CAPS: 50000.0000 [IU] | ORAL_CAPSULE | ORAL | 0 refills | Status: DC

## 2017-06-21 NOTE — Assessment & Plan Note (Signed)
Status post posterior interbody fusion.  6 weeks out since the surgery.  Will be following up with her surgeon.  Once she gets the clear to increase activity would like patient to get into formal physical therapy.  Patient will follow-up with me after that.

## 2017-06-21 NOTE — Patient Instructions (Signed)
Good to see you  Once weekly vitamin D for 12 weeks to aid in healing Ice is your friend Gabapentin another 2 weeks then you can decide if you want to stop and see how you feel  Write [place] when you are dong with Saintclair Halsted and we will get you in with Almira Coaster

## 2017-06-22 DIAGNOSIS — H00012 Hordeolum externum right lower eyelid: Secondary | ICD-10-CM | POA: Diagnosis not present

## 2017-06-26 DIAGNOSIS — H0012 Chalazion right lower eyelid: Secondary | ICD-10-CM | POA: Diagnosis not present

## 2017-07-10 ENCOUNTER — Encounter: Payer: Self-pay | Admitting: Family Medicine

## 2017-07-10 DIAGNOSIS — M5440 Lumbago with sciatica, unspecified side: Secondary | ICD-10-CM | POA: Diagnosis not present

## 2017-07-20 DIAGNOSIS — E785 Hyperlipidemia, unspecified: Secondary | ICD-10-CM | POA: Diagnosis not present

## 2017-07-20 DIAGNOSIS — I10 Essential (primary) hypertension: Secondary | ICD-10-CM | POA: Diagnosis not present

## 2017-07-20 DIAGNOSIS — L989 Disorder of the skin and subcutaneous tissue, unspecified: Secondary | ICD-10-CM | POA: Diagnosis not present

## 2017-07-24 DIAGNOSIS — N9089 Other specified noninflammatory disorders of vulva and perineum: Secondary | ICD-10-CM | POA: Diagnosis not present

## 2017-07-24 DIAGNOSIS — A609 Anogenital herpesviral infection, unspecified: Secondary | ICD-10-CM | POA: Diagnosis not present

## 2017-08-12 ENCOUNTER — Encounter: Payer: Self-pay | Admitting: Family Medicine

## 2017-08-13 ENCOUNTER — Encounter: Payer: Self-pay | Admitting: Family Medicine

## 2017-08-28 ENCOUNTER — Other Ambulatory Visit: Payer: Self-pay | Admitting: *Deleted

## 2017-08-28 NOTE — Patient Outreach (Signed)
Telephone call to pt for follow up on HTA health risk assessment completed by pt, spoke with pt, HIPAA verified, pt reports hypertension well controlled and pt checks BP at home on occasion, pt reports she sees primary care MD Dr. Harlan Stains twice yearly, pt states she has no needs for care management.  RN CM mailed successful outreach letter with 24 hour nurse line magnet to patient's home.  Jacqlyn Larsen [facility], [place] Coordinator [phone]

## 2017-09-03 ENCOUNTER — Other Ambulatory Visit: Payer: Self-pay | Admitting: Family Medicine

## 2017-09-09 ENCOUNTER — Other Ambulatory Visit: Payer: Self-pay | Admitting: Family Medicine

## 2017-10-05 DIAGNOSIS — Z23 Encounter for immunization: Secondary | ICD-10-CM | POA: Diagnosis not present

## 2017-10-11 DIAGNOSIS — M4316 Spondylolisthesis, lumbar region: Secondary | ICD-10-CM | POA: Diagnosis not present

## 2017-10-11 DIAGNOSIS — M5440 Lumbago with sciatica, unspecified side: Secondary | ICD-10-CM | POA: Diagnosis not present

## 2017-11-06 ENCOUNTER — Encounter: Payer: Self-pay | Admitting: Family Medicine

## 2017-11-29 DIAGNOSIS — Z86018 Personal history of other benign neoplasm: Secondary | ICD-10-CM | POA: Diagnosis not present

## 2017-11-29 DIAGNOSIS — L814 Other melanin hyperpigmentation: Secondary | ICD-10-CM | POA: Diagnosis not present

## 2017-11-29 DIAGNOSIS — D485 Neoplasm of uncertain behavior of skin: Secondary | ICD-10-CM | POA: Diagnosis not present

## 2017-11-29 DIAGNOSIS — L821 Other seborrheic keratosis: Secondary | ICD-10-CM | POA: Diagnosis not present

## 2017-11-29 DIAGNOSIS — D225 Melanocytic nevi of trunk: Secondary | ICD-10-CM | POA: Diagnosis not present

## 2017-11-29 DIAGNOSIS — L738 Other specified follicular disorders: Secondary | ICD-10-CM | POA: Diagnosis not present

## 2017-11-29 DIAGNOSIS — Z23 Encounter for immunization: Secondary | ICD-10-CM | POA: Diagnosis not present

## 2017-11-29 DIAGNOSIS — I781 Nevus, non-neoplastic: Secondary | ICD-10-CM | POA: Diagnosis not present

## 2017-11-29 DIAGNOSIS — L439 Lichen planus, unspecified: Secondary | ICD-10-CM | POA: Diagnosis not present

## 2017-11-29 DIAGNOSIS — D2271 Melanocytic nevi of right lower limb, including hip: Secondary | ICD-10-CM | POA: Diagnosis not present

## 2017-11-29 DIAGNOSIS — L918 Other hypertrophic disorders of the skin: Secondary | ICD-10-CM | POA: Diagnosis not present

## 2017-11-30 ENCOUNTER — Ambulatory Visit (INDEPENDENT_AMBULATORY_CARE_PROVIDER_SITE_OTHER): Payer: PPO | Admitting: Psychiatry

## 2017-11-30 ENCOUNTER — Encounter: Payer: Self-pay | Admitting: Psychiatry

## 2017-11-30 DIAGNOSIS — F331 Major depressive disorder, recurrent, moderate: Secondary | ICD-10-CM | POA: Diagnosis not present

## 2017-11-30 MED ORDER — DULOXETINE HCL 60 MG PO CPEP: 120.0000 mg | ORAL_CAPSULE | Freq: Every day | ORAL | 0 refills | Status: DC

## 2017-11-30 NOTE — Progress Notes (Signed)
Melody [name] [id number] Jan 14, 1945 72 y.o.  Subjective:   Patient ID:  Melody [name] is a 72 y.o. (DOB [birth year]) female.  Chief Complaint:  Chief Complaint  Patient presents with  . Follow-up    depressin and insomnia    HPI Melody [name] presents to the office today for follow-up of depression and anxiety.  In a slump for 2 mos.  No joy.  Increase caffeine to help energy.  Goes to bed early.  Needs more sleep.  No clear trigger.  Busy caring for GD.  Back surgery May successful.  No back pain now.  Recovered.  Pt reports that mood is Depressed and describes anxiety as Minimal. Anxiety symptoms include: Excessive Worry,. Pt reports sleeps excessively. Pt reports that appetite is good. Pt reports that energy is down significantly and poor and loss of interest or pleasure in usual activities, poor motivation and withdrawn from usual activities. Concentration is difficulty with focus and attention. Suicidal thoughts:  denied by patient.    Review of Systems:  Review of Systems  Medications: I have reviewed the patient's current medications.  Current Outpatient Medications  Medication Sig Dispense Refill  . aspirin 81 MG tablet Take 81 mg by mouth daily.      Marland Kitchen atorvastatin (LIPITOR) 20 MG tablet Take 20 mg by mouth at bedtime.     Marland Kitchen b complex vitamins tablet Take 1 tablet by mouth daily.      . benazepril (LOTENSIN) 20 MG tablet Take 20 mg by mouth daily.    . clonazePAM (KLONOPIN) 0.5 MG tablet Take 0.5 mg by mouth at bedtime.    . DULoxetine (CYMBALTA) 30 MG capsule Take 90 mg by mouth daily.     Marland Kitchen gabapentin (NEURONTIN) 100 MG capsule TAKE TWO CAPSULES BY MOUTH AT BEDTIME 180 capsule 0  . traZODone (DESYREL) 100 MG tablet Take 200 mg by mouth at bedtime.     . valACYclovir (VALTREX) 500 MG tablet Take 500 mg by mouth daily.    . Vitamin D, Ergocalciferol, (DRISDOL) 50000 units CAPS capsule TAKE ONE CAPSULE BY MOUTH EVERY 7 DAYS 12 capsule 0  . Calcium Carb-Cholecalciferol  (CALCIUM-VITAMIN D3) 600-400 MG-UNIT TABS Take 1 tablet by mouth daily.    . Multiple Vitamins-Minerals (MULTIVITAMIN PO) Take 1 tablet by mouth at bedtime.    . Turmeric 500 MG CAPS Take 500 mg by mouth daily.      No current facility-administered medications for this visit.     Medication Side Effects: None  Allergies:  Allergies  Allergen Reactions  . Zolpidem Tartrate Other (See Comments)    Talks on phone and won't remember doing so, etc.    Past Medical History:  Diagnosis Date  . Depression   . Hypertension     Family History  Problem Relation Age of Onset  . Heart disease Father     Social History   Socioeconomic History  . Marital status: Divorced    Spouse name: Not on file  . Number of children: Not on file  . Years of education: Not on file  . Highest education level: Not on file  Occupational History  . Not on file  Social Needs  . Financial resource strain: Not on file  . Food insecurity:    Worry: Not on file    Inability: Not on file  . Transportation needs:    Medical: Not on file    Non-medical: Not on file  Tobacco Use  . Smoking status: Never  Smoker  . Smokeless tobacco: Never Used  Substance and Sexual Activity  . Alcohol use: No  . Drug use: No  . Sexual activity: Not on file  Lifestyle  . Physical activity:    Days per week: Not on file    Minutes per session: Not on file  . Stress: Not on file  Relationships  . Social connections:    Talks on phone: Not on file    Gets together: Not on file    Attends religious service: Not on file    Active member of club or organization: Not on file    Attends meetings of clubs or organizations: Not on file    Relationship status: Not on file  . Intimate partner violence:    Fear of current or ex partner: Not on file    Emotionally abused: Not on file    Physically abused: Not on file    Forced sexual activity: Not on file  Other Topics Concern  . Not on file  Social History Narrative   . Not on file    Past Medical History, Surgical history, Social history, and Family history were reviewed and updated as appropriate.   Please see review of systems for further details on the patient's review from today.   Objective:   Physical Exam:  There were no vitals taken for this visit.  Physical Exam  Lab Review:     Component Value Date/Time   NA 139 05/25/2017 1309   K 4.0 05/25/2017 1309   CL 103 05/25/2017 1309   CO2 30 05/25/2017 1309   GLUCOSE 92 05/25/2017 1309   BUN 16 05/25/2017 1309   CREATININE 0.84 05/25/2017 1309   CALCIUM 9.4 05/25/2017 1309   PROT 6.8 10/13/2010 1537   ALBUMIN 3.5 10/13/2010 1537   AST 23 10/13/2010 1537   ALT 19 10/13/2010 1537   ALKPHOS 61 10/13/2010 1537   BILITOT 0.2 (L) 10/13/2010 1537   GFRNONAA >60 05/25/2017 1309   GFRAA >60 05/25/2017 1309       Component Value Date/Time   WBC 6.8 05/25/2017 1309   RBC 4.15 05/25/2017 1309   HGB 12.8 05/25/2017 1309   HCT 39.1 05/25/2017 1309   PLT 201 05/25/2017 1309   MCV 94.2 05/25/2017 1309   MCH 30.8 05/25/2017 1309   MCHC 32.7 05/25/2017 1309   RDW 14.0 05/25/2017 1309   LYMPHSABS 2.5 10/13/2010 0316   MONOABS 0.7 10/13/2010 0316   EOSABS 0.2 10/13/2010 0316   BASOSABS 0.0 10/13/2010 0316    No results found for: POCLITH, LITHIUM   No results found for: PHENYTOIN, PHENOBARB, VALPROATE, CBMZ   .res Assessment: Plan:    Major depressive disorder, recurrent episode, moderate ([place])   Reviewed her med history with her and the history of needeing high dosages of antidepressants.  She's been on 120mg  in the past.  Disc risk of increasing her BP.  She'll check. Increase the duloxetine to 120 mg/D. Check BP. FU 2 mos  Lynder Parents, MD, DFAPA  Please see After Visit Summary for patient specific instructions.  No future appointments.  No orders of the defined types were placed in this encounter.     -------------------------------

## 2017-12-01 ENCOUNTER — Other Ambulatory Visit: Payer: Self-pay | Admitting: Family Medicine

## 2018-01-02 ENCOUNTER — Encounter: Payer: Self-pay | Admitting: Emergency Medicine

## 2018-01-02 DIAGNOSIS — G4700 Insomnia, unspecified: Secondary | ICD-10-CM

## 2018-01-17 DIAGNOSIS — M5440 Lumbago with sciatica, unspecified side: Secondary | ICD-10-CM | POA: Diagnosis not present

## 2018-01-24 ENCOUNTER — Ambulatory Visit (INDEPENDENT_AMBULATORY_CARE_PROVIDER_SITE_OTHER): Payer: PPO | Admitting: Psychiatry

## 2018-01-24 ENCOUNTER — Encounter: Payer: Self-pay | Admitting: Psychiatry

## 2018-01-24 DIAGNOSIS — F331 Major depressive disorder, recurrent, moderate: Secondary | ICD-10-CM | POA: Diagnosis not present

## 2018-01-24 MED ORDER — DULOXETINE HCL 30 MG PO CPEP: 90.0000 mg | ORAL_CAPSULE | Freq: Every day | ORAL | 0 refills | Status: DC

## 2018-01-24 MED ORDER — TRAZODONE HCL 100 MG PO TABS: 200.0000 mg | ORAL_TABLET | Freq: Every day | ORAL | 0 refills | Status: DC

## 2018-01-24 MED ORDER — ARIPIPRAZOLE 5 MG PO TABS: ORAL_TABLET | ORAL | 0 refills | Status: DC

## 2018-01-24 MED ORDER — CLONAZEPAM 0.5 MG PO TABS: 0.5000 mg | ORAL_TABLET | Freq: Every day | ORAL | 0 refills | Status: DC

## 2018-01-24 NOTE — Progress Notes (Signed)
Melody [name] [id number] September 11, 1945 73 y.o.  Subjective:   Patient ID:  Melody [name] is a 73 y.o. (DOB 06/02/1945) female.  Chief Complaint:  Chief Complaint  Patient presents with  . Follow-up    Medication Management    HPI  Last seen in November  Melody [name] presents to the office today for follow-up of depression and anxiety.   No change with the increase in duloxetine.  Unsure if depressed or overdoing things.  A lot of commitments.  Really tired by middle of day despite plenty of sleep.     In a slump for 2 mos.  No joy.  Increase caffeine to help energy.  Goes to bed early.  Needs more sleep.  No clear trigger.  Busy caring for GD.  Back surgery May successful.  No back pain now.  Recovered.  Pt reports that mood is Depressed and describes anxiety as Minimal. Anxiety symptoms include: Excessive Worry,. Pt reports sleeps excessively. Pt reports that appetite is good. Pt reports that energy is down significantly and poor and loss of interest or pleasure in usual activities, poor motivation and withdrawn from usual activities. Concentration is difficulty with focus and attention. Suicidal thoughts:  denied by patient.    Review of Systems:  Review of Systems  Neurological: Negative for tremors and weakness.  Psychiatric/Behavioral: Negative for agitation, behavioral problems, confusion, decreased concentration, dysphoric mood, hallucinations, self-injury, sleep disturbance and suicidal ideas. The patient is not nervous/anxious and is not hyperactive.     Medications: I have reviewed the patient's current medications.  Current Outpatient Medications  Medication Sig Dispense Refill  . aspirin 81 MG tablet Take 81 mg by mouth daily.      Marland Kitchen atorvastatin (LIPITOR) 20 MG tablet Take 20 mg by mouth at bedtime.     Marland Kitchen b complex vitamins tablet Take 1 tablet by mouth daily.      . benazepril (LOTENSIN) 20 MG tablet Take 20 mg by mouth daily.    . Calcium Carb-Cholecalciferol  (CALCIUM-VITAMIN D3) 600-400 MG-UNIT TABS Take 1 tablet by mouth daily.    . clonazePAM (KLONOPIN) 0.5 MG tablet Take 0.5 mg by mouth at bedtime.    . gabapentin (NEURONTIN) 100 MG capsule TAKE TWO CAPSULES BY MOUTH AT BEDTIME (Patient taking differently: 110 mg. ) 180 capsule 0  . Multiple Vitamins-Minerals (MULTIVITAMIN PO) Take 1 tablet by mouth at bedtime.    . traZODone (DESYREL) 100 MG tablet Take 200 mg by mouth at bedtime.     . Turmeric 500 MG CAPS Take 500 mg by mouth daily.     . valACYclovir (VALTREX) 500 MG tablet Take 500 mg by mouth daily.    . Vitamin D, Ergocalciferol, (DRISDOL) 1.25 MG (50000 UT) CAPS capsule TAKE ONE CAPSULE BY MOUTH EVERY 7 DAYS 12 capsule 0  . ARIPiprazole (ABILIFY) 5 MG tablet 1/2 tablet for 1 week then 1 daily. 90 tablet 0  . DULoxetine (CYMBALTA) 30 MG capsule Take 3 capsules (90 mg total) by mouth daily. 270 capsule 0   No current facility-administered medications for this visit.     Medication Side Effects: None  Allergies:  Allergies  Allergen Reactions  . Zolpidem Tartrate Other (See Comments)    Talks on phone and won't remember doing so, etc.    Past Medical History:  Diagnosis Date  . Depression   . Hypertension     Family History  Problem Relation Age of Onset  . Heart disease Father  Social History   Socioeconomic History  . Marital status: Divorced    Spouse name: Not on file  . Number of children: Not on file  . Years of education: Not on file  . Highest education level: Not on file  Occupational History  . Not on file  Social Needs  . Financial resource strain: Not on file  . Food insecurity:    Worry: Not on file    Inability: Not on file  . Transportation needs:    Medical: Not on file    Non-medical: Not on file  Tobacco Use  . Smoking status: Never Smoker  . Smokeless tobacco: Never Used  Substance and Sexual Activity  . Alcohol use: No  . Drug use: No  . Sexual activity: Not on file  Lifestyle  .  Physical activity:    Days per week: Not on file    Minutes per session: Not on file  . Stress: Not on file  Relationships  . Social connections:    Talks on phone: Not on file    Gets together: Not on file    Attends religious service: Not on file    Active member of club or organization: Not on file    Attends meetings of clubs or organizations: Not on file    Relationship status: Not on file  . Intimate partner violence:    Fear of current or ex partner: Not on file    Emotionally abused: Not on file    Physically abused: Not on file    Forced sexual activity: Not on file  Other Topics Concern  . Not on file  Social History Narrative  . Not on file    Past Medical History, Surgical history, Social history, and Family history were reviewed and updated as appropriate.   Please see review of systems for further details on the patient's review from today.   Objective:   Physical Exam:  There were no vitals taken for this visit.  Physical Exam  Lab Review:     Component Value Date/Time   NA 139 05/25/2017 1309   K 4.0 05/25/2017 1309   CL 103 05/25/2017 1309   CO2 30 05/25/2017 1309   GLUCOSE 92 05/25/2017 1309   BUN 16 05/25/2017 1309   CREATININE 0.84 05/25/2017 1309   CALCIUM 9.4 05/25/2017 1309   PROT 6.8 10/13/2010 1537   ALBUMIN 3.5 10/13/2010 1537   AST 23 10/13/2010 1537   ALT 19 10/13/2010 1537   ALKPHOS 61 10/13/2010 1537   BILITOT 0.2 (L) 10/13/2010 1537   GFRNONAA >60 05/25/2017 1309   GFRAA >60 05/25/2017 1309       Component Value Date/Time   WBC 6.8 05/25/2017 1309   RBC 4.15 05/25/2017 1309   HGB 12.8 05/25/2017 1309   HCT 39.1 05/25/2017 1309   PLT 201 05/25/2017 1309   MCV 94.2 05/25/2017 1309   MCH 30.8 05/25/2017 1309   MCHC 32.7 05/25/2017 1309   RDW 14.0 05/25/2017 1309   LYMPHSABS 2.5 10/13/2010 0316   MONOABS 0.7 10/13/2010 0316   EOSABS 0.2 10/13/2010 0316   BASOSABS 0.0 10/13/2010 0316    No results found for: POCLITH,  LITHIUM   No results found for: PHENYTOIN, PHENOBARB, VALPROATE, CBMZ   .res Assessment: Plan:    Major depressive disorder, recurrent episode, moderate (HCC)   Drop Cymbalta back to 90 mg DT lack of benefit with the increase and restart Abilify which worked before. 2.5mg  for 1week, then 1 tablet  if needed.  FU 2 mos  Lynder Parents, MD, DFAPA  Please see After Visit Summary for patient specific instructions.  No future appointments.  No orders of the defined types were placed in this encounter.     -------------------------------

## 2018-01-24 NOTE — Progress Notes (Signed)
Previous Psych Trials include: Prozac, Paxil, Zoloft, Wellbutrin, Effexor, Abilify, Trazodone and Ambien.

## 2018-02-01 DIAGNOSIS — A609 Anogenital herpesviral infection, unspecified: Secondary | ICD-10-CM | POA: Diagnosis not present

## 2018-02-01 DIAGNOSIS — I10 Essential (primary) hypertension: Secondary | ICD-10-CM | POA: Diagnosis not present

## 2018-02-01 DIAGNOSIS — Z1231 Encounter for screening mammogram for malignant neoplasm of breast: Secondary | ICD-10-CM | POA: Diagnosis not present

## 2018-02-01 DIAGNOSIS — M8589 Other specified disorders of bone density and structure, multiple sites: Secondary | ICD-10-CM | POA: Diagnosis not present

## 2018-02-01 DIAGNOSIS — F3341 Major depressive disorder, recurrent, in partial remission: Secondary | ICD-10-CM | POA: Diagnosis not present

## 2018-02-01 DIAGNOSIS — E785 Hyperlipidemia, unspecified: Secondary | ICD-10-CM | POA: Diagnosis not present

## 2018-02-01 DIAGNOSIS — Z981 Arthrodesis status: Secondary | ICD-10-CM | POA: Diagnosis not present

## 2018-02-01 DIAGNOSIS — L900 Lichen sclerosus et atrophicus: Secondary | ICD-10-CM | POA: Diagnosis not present

## 2018-02-01 DIAGNOSIS — E559 Vitamin D deficiency, unspecified: Secondary | ICD-10-CM | POA: Diagnosis not present

## 2018-02-01 DIAGNOSIS — Z0000 Encounter for general adult medical examination without abnormal findings: Secondary | ICD-10-CM | POA: Diagnosis not present

## 2018-02-01 DIAGNOSIS — N952 Postmenopausal atrophic vaginitis: Secondary | ICD-10-CM | POA: Diagnosis not present

## 2018-02-06 DIAGNOSIS — Z1211 Encounter for screening for malignant neoplasm of colon: Secondary | ICD-10-CM | POA: Diagnosis not present

## 2018-02-22 ENCOUNTER — Other Ambulatory Visit: Payer: Self-pay | Admitting: Family Medicine

## 2018-02-28 ENCOUNTER — Other Ambulatory Visit: Payer: Self-pay | Admitting: Psychiatry

## 2018-03-07 ENCOUNTER — Ambulatory Visit (INDEPENDENT_AMBULATORY_CARE_PROVIDER_SITE_OTHER): Payer: PPO | Admitting: Psychiatry

## 2018-03-07 ENCOUNTER — Encounter: Payer: Self-pay | Admitting: Psychiatry

## 2018-03-07 DIAGNOSIS — F5105 Insomnia due to other mental disorder: Secondary | ICD-10-CM | POA: Diagnosis not present

## 2018-03-07 DIAGNOSIS — F331 Major depressive disorder, recurrent, moderate: Secondary | ICD-10-CM | POA: Diagnosis not present

## 2018-03-07 NOTE — Patient Instructions (Addendum)
[facility]  N-Acetylcysteine at 600 mg capsule 1 daily for concentration and memory and word finding problems. [email or web address]

## 2018-03-07 NOTE — Progress Notes (Signed)
Melody [name] [id number] 03-07-1945 73 y.o.  Subjective:   Patient ID:  Melody [name] is a 73 y.o. (DOB [birth year]) female.  Chief Complaint:  Chief Complaint  Patient presents with  . Follow-up    Medication Management    HPI  Last seen in January 24, 2018 Melody [name] presents to the office today for follow-up of depression and anxiety.   At the last visit patient had been depressed for about 2 months and we dropped the duloxetine to 90 and added Abilify 2.5 AM.  Depression resolved but EMA wide awake.  Hasn't interfered with life.  Usually still able to get 9-10 hours.  Not sleepy usually but may use caffeine.    Busy caring for GD.  Daughter has noticed that the patient has been a little more forgetful lately.  She will miss appointments occasionally.  Back surgery May successful.  No back pain now.  Recovered.  Patient reports stable mood and denies depressed or irritable moods.  Patient denies any recent difficulty with anxiety.  Denies appetite disturbance.  Patient reports that energy and motivation have been good.   Patient denies any suicidal ideation.  Past Psychiatric Medication Trials: Abilify 5 blunted, duloxetine 120, trazodone 200, Prozac 40 mg, bupropion 300 mg, sertraline 200 mg, Effexor 225 mg, ProSom, Xanax, clonazepam, temazepam, Ambien, Paxil 30, lithium 450, Sonata, trazodone 200, Abilify 10 Under my psychiatric care since 1998  Review of Systems:  Review of Systems  Neurological: Negative for tremors and weakness.  Psychiatric/Behavioral: Negative for agitation, behavioral problems, confusion, decreased concentration, dysphoric mood, hallucinations, self-injury, sleep disturbance and suicidal ideas. The patient is not nervous/anxious and is not hyperactive.     Medications: I have reviewed the patient's current medications.  Current Outpatient Medications  Medication Sig Dispense Refill  . ARIPiprazole (ABILIFY) 5 MG tablet 1/2 tablet for 1 week then 1  daily. (Patient taking differently: Take 2.5 mg by mouth daily. ) 90 tablet 0  . aspirin 81 MG tablet Take 81 mg by mouth daily.      Marland Kitchen atorvastatin (LIPITOR) 20 MG tablet Take 20 mg by mouth at bedtime.     Marland Kitchen b complex vitamins tablet Take 1 tablet by mouth daily.      . benazepril (LOTENSIN) 20 MG tablet Take 20 mg by mouth daily.    . Calcium Carb-Cholecalciferol (CALCIUM-VITAMIN D3) 600-400 MG-UNIT TABS Take 1 tablet by mouth daily.    . clonazePAM (KLONOPIN) 0.5 MG tablet Take 1 tablet (0.5 mg total) by mouth at bedtime. 90 tablet 0  . DULoxetine (CYMBALTA) 30 MG capsule Take 3 capsules (90 mg total) by mouth daily. 270 capsule 0  . Multiple Vitamins-Minerals (MULTIVITAMIN PO) Take 1 tablet by mouth at bedtime.    . traZODone (DESYREL) 100 MG tablet Take 2 tablets (200 mg total) by mouth at bedtime. 180 tablet 0  . Turmeric 500 MG CAPS Take 500 mg by mouth daily.     . valACYclovir (VALTREX) 500 MG tablet Take 500 mg by mouth daily.    . Vitamin D, Ergocalciferol, (DRISDOL) 1.25 MG (50000 UT) CAPS capsule TAKE ONE CAPSULE BY MOUTH EVERY 7 DAYS 12 capsule 0   No current facility-administered medications for this visit.     Medication Side Effects: None  Allergies:  Allergies  Allergen Reactions  . Zolpidem Tartrate Other (See Comments)    Talks on phone and won't remember doing so, etc.    Past Medical History:  Diagnosis Date  . Depression   .  Hypertension     Family History  Problem Relation Age of Onset  . Heart disease Father     Social History   Socioeconomic History  . Marital status: Divorced    Spouse name: Not on file  . Number of children: Not on file  . Years of education: Not on file  . Highest education level: Not on file  Occupational History  . Not on file  Social Needs  . Financial resource strain: Not on file  . Food insecurity:    Worry: Not on file    Inability: Not on file  . Transportation needs:    Medical: Not on file    Non-medical:  Not on file  Tobacco Use  . Smoking status: Never Smoker  . Smokeless tobacco: Never Used  Substance and Sexual Activity  . Alcohol use: No  . Drug use: No  . Sexual activity: Not on file  Lifestyle  . Physical activity:    Days per week: Not on file    Minutes per session: Not on file  . Stress: Not on file  Relationships  . Social connections:    Talks on phone: Not on file    Gets together: Not on file    Attends religious service: Not on file    Active member of club or organization: Not on file    Attends meetings of clubs or organizations: Not on file    Relationship status: Not on file  . Intimate partner violence:    Fear of current or ex partner: Not on file    Emotionally abused: Not on file    Physically abused: Not on file    Forced sexual activity: Not on file  Other Topics Concern  . Not on file  Social History Narrative  . Not on file    Past Medical History, Surgical history, Social history, and Family history were reviewed and updated as appropriate.   Please see review of systems for further details on the patient's review from today.   Objective:   Physical Exam:  There were no vitals taken for this visit.  Physical Exam Constitutional:      General: She is not in acute distress.    Appearance: She is well-developed.  Musculoskeletal:        General: No deformity.  Neurological:     Mental Status: She is alert and oriented to person, place, and time.     Coordination: Coordination normal.  Psychiatric:        Attention and Perception: Attention normal. She is attentive.        Mood and Affect: Mood normal. Mood is not anxious or depressed. Affect is not labile, blunt, angry or inappropriate.        Speech: Speech normal.        Behavior: Behavior normal.        Thought Content: Thought content normal. Thought content does not include homicidal or suicidal ideation.        Cognition and Memory: Cognition normal.        Judgment: Judgment  normal.     Comments: Insight is good.     Lab Review:     Component Value Date/Time   NA 139 05/25/2017 1309   K 4.0 05/25/2017 1309   CL 103 05/25/2017 1309   CO2 30 05/25/2017 1309   GLUCOSE 92 05/25/2017 1309   BUN 16 05/25/2017 1309   CREATININE 0.84 05/25/2017 1309   CALCIUM 9.4 05/25/2017 1309  PROT 6.8 10/13/2010 1537   ALBUMIN 3.5 10/13/2010 1537   AST 23 10/13/2010 1537   ALT 19 10/13/2010 1537   ALKPHOS 61 10/13/2010 1537   BILITOT 0.2 (L) 10/13/2010 1537   GFRNONAA >60 05/25/2017 1309   GFRAA >60 05/25/2017 1309       Component Value Date/Time   WBC 6.8 05/25/2017 1309   RBC 4.15 05/25/2017 1309   HGB 12.8 05/25/2017 1309   HCT 39.1 05/25/2017 1309   PLT 201 05/25/2017 1309   MCV 94.2 05/25/2017 1309   MCH 30.8 05/25/2017 1309   MCHC 32.7 05/25/2017 1309   RDW 14.0 05/25/2017 1309   LYMPHSABS 2.5 10/13/2010 0316   MONOABS 0.7 10/13/2010 0316   EOSABS 0.2 10/13/2010 0316   BASOSABS 0.0 10/13/2010 0316    No results found for: POCLITH, LITHIUM   No results found for: PHENYTOIN, PHENOBARB, VALPROATE, CBMZ   .res Assessment: Plan:    Major depressive disorder, recurrent episode, moderate (HCC)  Insomnia due to mental condition   The depression has resolved with the addition of Abilify 2.5 mg daily.  This is worked in the past also.  However she has developed insomnia as a side effect of the Abilify.  She is getting adequate total amount of sleep but it is disrupted.  This should improve with time.  Discussed potential metabolic side effects associated with atypical antipsychotics, as well as potential risk for movement side effects. Advised pt to contact office if movement side effects occur.   Some of the cognitive problems or all of them could be related to the recent depression and may resolve on their own however, disc the off-label use of N-Acetylcysteine at 600 mg daily to help with mild cognitive problems.  It can be combined with a B-complex  vitamin as the B-12 and folate have been shown to sometimes enhance the effect.  No other med changes  FU 4 mos  Lynder Parents, MD, DFAPA  Please see After Visit Summary for patient specific instructions.  No future appointments.  No orders of the defined types were placed in this encounter.     -------------------------------

## 2018-03-22 DIAGNOSIS — E785 Hyperlipidemia, unspecified: Secondary | ICD-10-CM | POA: Diagnosis not present

## 2018-03-22 DIAGNOSIS — Z79899 Other long term (current) drug therapy: Secondary | ICD-10-CM | POA: Diagnosis not present

## 2018-04-21 ENCOUNTER — Other Ambulatory Visit: Payer: Self-pay | Admitting: Psychiatry

## 2018-05-04 ENCOUNTER — Other Ambulatory Visit: Payer: Self-pay | Admitting: Psychiatry

## 2018-05-14 ENCOUNTER — Other Ambulatory Visit: Payer: Self-pay | Admitting: Family Medicine

## 2018-06-06 ENCOUNTER — Other Ambulatory Visit: Payer: Self-pay | Admitting: Psychiatry

## 2018-06-27 ENCOUNTER — Encounter: Payer: Self-pay | Admitting: Psychiatry

## 2018-06-27 ENCOUNTER — Other Ambulatory Visit: Payer: Self-pay

## 2018-06-27 ENCOUNTER — Ambulatory Visit (INDEPENDENT_AMBULATORY_CARE_PROVIDER_SITE_OTHER): Payer: PPO | Admitting: Psychiatry

## 2018-06-27 DIAGNOSIS — F331 Major depressive disorder, recurrent, moderate: Secondary | ICD-10-CM | POA: Diagnosis not present

## 2018-06-27 DIAGNOSIS — F5105 Insomnia due to other mental disorder: Secondary | ICD-10-CM

## 2018-06-27 NOTE — Progress Notes (Signed)
Melody [name] [id number] [birth year] 73 y.o.  Subjective:   Patient ID:  Melody [name] is a 73 y.o. (DOB February 07, 1945) female.  Chief Complaint:  Chief Complaint  Patient presents with  . Follow-up    Medication Management    HPI   Melody [name] presents to the office today for follow-up of depression and anxiety.   Last seen March 07, 2018.  N-acetylcysteine 600 mg was added for mild cognitive complaints.  No other med changes were made.  Doing really well.  No unusual depressive episodes.    At the last visit patient had been depressed for about 2 months and we dropped the duloxetine to 90 and added Abilify 2.5 AM.  Depression resolved but EMA wide awake.  Hasn't interfered with life.  Usually still able to get 9-10 hours.  Not sleepy usually but may use caffeine.    Busy caring for GD.  Daughter has noticed that the patient has been a little more forgetful lately.  She will miss appointments occasionally.  Back surgery May successful.  No back pain now.  Recovered.  Patient reports stable mood and denies depressed or irritable moods.  Patient denies any recent difficulty with anxiety.  Denies appetite disturbance.  Patient reports that energy and motivation have been good.   Patient denies any suicidal ideation.  Sleep fine overall.  Past Psychiatric Medication Trials: Abilify 5 blunted, duloxetine 120, trazodone 200, Prozac 40 mg, bupropion 300 mg, sertraline 200 mg, Effexor 225 mg, ProSom, Xanax, clonazepam, temazepam, Ambien, Paxil 30, lithium 450, Sonata, trazodone 200, Abilify 10 Under my psychiatric care since 1998  Review of Systems:  Review of Systems  Neurological: Negative for tremors and weakness.  Psychiatric/Behavioral: Negative for agitation, behavioral problems, confusion, decreased concentration, dysphoric mood, hallucinations, self-injury, sleep disturbance and suicidal ideas. The patient is not nervous/anxious and is not hyperactive.     Medications: I  have reviewed the patient's current medications.  Current Outpatient Medications  Medication Sig Dispense Refill  . Acetylcysteine 600 MG CAPS Take 600 mg by mouth daily.    . ARIPiprazole (ABILIFY) 5 MG tablet 1 TABLET BY MOUTH DAILY 90 tablet 0  . aspirin 81 MG tablet Take 81 mg by mouth daily.      Marland Kitchen atorvastatin (LIPITOR) 20 MG tablet Take 20 mg by mouth at bedtime.     Marland Kitchen b complex vitamins tablet Take 1 tablet by mouth daily.      . benazepril (LOTENSIN) 20 MG tablet Take 20 mg by mouth daily.    . Calcium Carb-Cholecalciferol (CALCIUM-VITAMIN D3) 600-400 MG-UNIT TABS Take 1 tablet by mouth daily.    . clonazePAM (KLONOPIN) 0.5 MG tablet TAKE ONE TABLET BY MOUTH AT BEDTIME 90 tablet 0  . DULoxetine (CYMBALTA) 30 MG capsule Take 3 capsules (90 mg total) by mouth daily. 270 capsule 0  . Multiple Vitamins-Minerals (MULTIVITAMIN PO) Take 1 tablet by mouth at bedtime.    . Omega-3 Fatty Acids (FISH OIL) 1000 MG CAPS Take by mouth.    . traZODone (DESYREL) 100 MG tablet TAKE TWO TABLETS BY MOUTH AT BEDTIME 180 tablet 0  . valACYclovir (VALTREX) 500 MG tablet Take 500 mg by mouth daily.     No current facility-administered medications for this visit.     Medication Side Effects: None  Allergies:  Allergies  Allergen Reactions  . Zolpidem Tartrate Other (See Comments)    Talks on phone and won't remember doing so, etc.    Past Medical History:  Diagnosis Date  . Depression   . Hypertension     Family History  Problem Relation Age of Onset  . Heart disease Father     Social History   Socioeconomic History  . Marital status: Divorced    Spouse name: Not on file  . Number of children: Not on file  . Years of education: Not on file  . Highest education level: Not on file  Occupational History  . Not on file  Social Needs  . Financial resource strain: Not on file  . Food insecurity    Worry: Not on file    Inability: Not on file  . Transportation needs    Medical: Not  on file    Non-medical: Not on file  Tobacco Use  . Smoking status: Never Smoker  . Smokeless tobacco: Never Used  Substance and Sexual Activity  . Alcohol use: No  . Drug use: No  . Sexual activity: Not on file  Lifestyle  . Physical activity    Days per week: Not on file    Minutes per session: Not on file  . Stress: Not on file  Relationships  . Social Herbalist on phone: Not on file    Gets together: Not on file    Attends religious service: Not on file    Active member of club or organization: Not on file    Attends meetings of clubs or organizations: Not on file    Relationship status: Not on file  . Intimate partner violence    Fear of current or ex partner: Not on file    Emotionally abused: Not on file    Physically abused: Not on file    Forced sexual activity: Not on file  Other Topics Concern  . Not on file  Social History Narrative  . Not on file    Past Medical History, Surgical history, Social history, and Family history were reviewed and updated as appropriate.   Please see review of systems for further details on the patient's review from today.   Objective:   Physical Exam:  There were no vitals taken for this visit.  Physical Exam Constitutional:      General: She is not in acute distress.    Appearance: She is well-developed.  Musculoskeletal:        General: No deformity.  Neurological:     Mental Status: She is alert and oriented to person, place, and time.     Coordination: Coordination normal.  Psychiatric:        Attention and Perception: Attention normal. She is attentive.        Mood and Affect: Mood normal. Mood is not anxious or depressed. Affect is not labile, blunt, angry or inappropriate.        Speech: Speech normal.        Behavior: Behavior normal.        Thought Content: Thought content normal. Thought content does not include homicidal or suicidal ideation.        Cognition and Memory: Cognition normal.         Judgment: Judgment normal.     Comments: Insight is good.     Lab Review:     Component Value Date/Time   NA 139 05/25/2017 1309   K 4.0 05/25/2017 1309   CL 103 05/25/2017 1309   CO2 30 05/25/2017 1309   GLUCOSE 92 05/25/2017 1309   BUN 16 05/25/2017 1309   CREATININE 0.84 05/25/2017  1309   CALCIUM 9.4 05/25/2017 1309   PROT 6.8 10/13/2010 1537   ALBUMIN 3.5 10/13/2010 1537   AST 23 10/13/2010 1537   ALT 19 10/13/2010 1537   ALKPHOS 61 10/13/2010 1537   BILITOT 0.2 (L) 10/13/2010 1537   GFRNONAA >60 05/25/2017 1309   GFRAA >60 05/25/2017 1309       Component Value Date/Time   WBC 6.8 05/25/2017 1309   RBC 4.15 05/25/2017 1309   HGB 12.8 05/25/2017 1309   HCT 39.1 05/25/2017 1309   PLT 201 05/25/2017 1309   MCV 94.2 05/25/2017 1309   MCH 30.8 05/25/2017 1309   MCHC 32.7 05/25/2017 1309   RDW 14.0 05/25/2017 1309   LYMPHSABS 2.5 10/13/2010 0316   MONOABS 0.7 10/13/2010 0316   EOSABS 0.2 10/13/2010 0316   BASOSABS 0.0 10/13/2010 0316    No results found for: POCLITH, LITHIUM   No results found for: PHENYTOIN, PHENOBARB, VALPROATE, CBMZ   .res Assessment: Plan:    Zahriah was seen today for follow-up.  Diagnoses and all orders for this visit:  Major depressive disorder, recurrent episode, moderate (HCC)  Insomnia due to mental condition   The depression has resolved with the addition of Abilify 5 mg daily.  This is worked in the past also. She wants to try stopping it.  Cameron.  Call if relapse.  Options increase Abilify or duloxetine.  Discussed potential metabolic side effects associated with atypical antipsychotics, as well as potential risk for movement side effects. Advised pt to contact office if movement side effects occur.   Some of the cognitive problems or all of them could be related to the recent depression and may resolve on their own however, disc the off-label use of N-Acetylcysteine at 600 mg daily to help with mild cognitive problems.  She feels  it's been helpful.  It can be combined with a B-complex vitamin as the B-12 and folate have been shown to sometimes enhance the effect.  FU 4 mos  Lynder Parents, MD, DFAPA  Please see After Visit Summary for patient specific instructions.  No future appointments.  No orders of the defined types were placed in this encounter.     -------------------------------

## 2018-07-01 DIAGNOSIS — F325 Major depressive disorder, single episode, in full remission: Secondary | ICD-10-CM | POA: Diagnosis not present

## 2018-07-01 DIAGNOSIS — E785 Hyperlipidemia, unspecified: Secondary | ICD-10-CM | POA: Diagnosis not present

## 2018-07-01 DIAGNOSIS — I10 Essential (primary) hypertension: Secondary | ICD-10-CM | POA: Diagnosis not present

## 2018-07-01 DIAGNOSIS — M8580 Other specified disorders of bone density and structure, unspecified site: Secondary | ICD-10-CM | POA: Diagnosis not present

## 2018-07-01 DIAGNOSIS — F3341 Major depressive disorder, recurrent, in partial remission: Secondary | ICD-10-CM | POA: Diagnosis not present

## 2018-07-03 ENCOUNTER — Other Ambulatory Visit: Payer: Self-pay

## 2018-07-03 MED ORDER — ARIPIPRAZOLE 5 MG PO TABS: ORAL_TABLET | ORAL | 0 refills | Status: DC

## 2018-07-03 MED ORDER — DULOXETINE HCL 30 MG PO CPEP: 90.0000 mg | ORAL_CAPSULE | Freq: Every day | ORAL | 0 refills | Status: DC

## 2018-07-03 MED ORDER — TRAZODONE HCL 100 MG PO TABS: 200.0000 mg | ORAL_TABLET | Freq: Every day | ORAL | 0 refills | Status: DC

## 2018-07-03 NOTE — Telephone Encounter (Signed)
Pt has new pharmacy, [facility] in [place].

## 2018-07-26 DIAGNOSIS — E785 Hyperlipidemia, unspecified: Secondary | ICD-10-CM | POA: Diagnosis not present

## 2018-07-26 DIAGNOSIS — I10 Essential (primary) hypertension: Secondary | ICD-10-CM | POA: Diagnosis not present

## 2018-08-06 ENCOUNTER — Other Ambulatory Visit: Payer: Self-pay

## 2018-08-06 MED ORDER — CLONAZEPAM 0.5 MG PO TABS: 0.5000 mg | ORAL_TABLET | Freq: Every day | ORAL | 0 refills | Status: DC

## 2018-08-07 ENCOUNTER — Telehealth: Payer: Self-pay | Admitting: Psychiatry

## 2018-08-07 ENCOUNTER — Other Ambulatory Visit: Payer: Self-pay

## 2018-08-07 MED ORDER — CLONAZEPAM 0.5 MG PO TABS: 0.5000 mg | ORAL_TABLET | Freq: Every day | ORAL | 0 refills | Status: DC

## 2018-08-07 NOTE — Telephone Encounter (Signed)
error 

## 2018-08-07 NOTE — Telephone Encounter (Signed)
Carissa from [facility] called patient needs Clorazepam has not had the medication since last night

## 2018-08-08 DIAGNOSIS — I10 Essential (primary) hypertension: Secondary | ICD-10-CM | POA: Diagnosis not present

## 2018-08-08 DIAGNOSIS — F325 Major depressive disorder, single episode, in full remission: Secondary | ICD-10-CM | POA: Diagnosis not present

## 2018-08-08 DIAGNOSIS — F3341 Major depressive disorder, recurrent, in partial remission: Secondary | ICD-10-CM | POA: Diagnosis not present

## 2018-08-08 DIAGNOSIS — M8580 Other specified disorders of bone density and structure, unspecified site: Secondary | ICD-10-CM | POA: Diagnosis not present

## 2018-08-08 DIAGNOSIS — E785 Hyperlipidemia, unspecified: Secondary | ICD-10-CM | POA: Diagnosis not present

## 2018-10-16 DIAGNOSIS — Z23 Encounter for immunization: Secondary | ICD-10-CM | POA: Diagnosis not present

## 2018-10-27 ENCOUNTER — Other Ambulatory Visit: Payer: Self-pay | Admitting: Psychiatry

## 2018-10-28 NOTE — Telephone Encounter (Signed)
Asking for refills on klonopin Due back in Dec

## 2018-10-29 ENCOUNTER — Telehealth: Payer: Self-pay | Admitting: Psychiatry

## 2018-10-29 NOTE — Telephone Encounter (Signed)
These were both submitted to [facility] yesterday

## 2018-10-29 NOTE — Telephone Encounter (Signed)
Almyra Free from [facility] called to request refills on Clonazepam and Trazodone to be sent to [facility] [address] [place] [phone]

## 2018-10-30 DIAGNOSIS — I10 Essential (primary) hypertension: Secondary | ICD-10-CM | POA: Diagnosis not present

## 2018-10-30 DIAGNOSIS — D225 Melanocytic nevi of trunk: Secondary | ICD-10-CM | POA: Diagnosis not present

## 2018-10-30 DIAGNOSIS — D2271 Melanocytic nevi of right lower limb, including hip: Secondary | ICD-10-CM | POA: Diagnosis not present

## 2018-10-30 DIAGNOSIS — Z23 Encounter for immunization: Secondary | ICD-10-CM | POA: Diagnosis not present

## 2018-10-30 DIAGNOSIS — Z86018 Personal history of other benign neoplasm: Secondary | ICD-10-CM | POA: Diagnosis not present

## 2018-10-30 DIAGNOSIS — L821 Other seborrheic keratosis: Secondary | ICD-10-CM | POA: Diagnosis not present

## 2018-10-30 DIAGNOSIS — D2272 Melanocytic nevi of left lower limb, including hip: Secondary | ICD-10-CM | POA: Diagnosis not present

## 2018-10-30 DIAGNOSIS — M8580 Other specified disorders of bone density and structure, unspecified site: Secondary | ICD-10-CM | POA: Diagnosis not present

## 2018-10-30 DIAGNOSIS — E785 Hyperlipidemia, unspecified: Secondary | ICD-10-CM | POA: Diagnosis not present

## 2018-10-30 DIAGNOSIS — F3341 Major depressive disorder, recurrent, in partial remission: Secondary | ICD-10-CM | POA: Diagnosis not present

## 2018-10-30 DIAGNOSIS — F325 Major depressive disorder, single episode, in full remission: Secondary | ICD-10-CM | POA: Diagnosis not present

## 2018-10-30 DIAGNOSIS — L814 Other melanin hyperpigmentation: Secondary | ICD-10-CM | POA: Diagnosis not present

## 2018-11-17 ENCOUNTER — Other Ambulatory Visit: Payer: Self-pay | Admitting: Psychiatry

## 2018-12-04 DIAGNOSIS — I10 Essential (primary) hypertension: Secondary | ICD-10-CM | POA: Diagnosis not present

## 2018-12-04 DIAGNOSIS — E785 Hyperlipidemia, unspecified: Secondary | ICD-10-CM | POA: Diagnosis not present

## 2018-12-04 DIAGNOSIS — M8580 Other specified disorders of bone density and structure, unspecified site: Secondary | ICD-10-CM | POA: Diagnosis not present

## 2018-12-04 DIAGNOSIS — F325 Major depressive disorder, single episode, in full remission: Secondary | ICD-10-CM | POA: Diagnosis not present

## 2018-12-04 DIAGNOSIS — F3341 Major depressive disorder, recurrent, in partial remission: Secondary | ICD-10-CM | POA: Diagnosis not present

## 2018-12-27 ENCOUNTER — Ambulatory Visit: Payer: PPO | Admitting: Psychiatry

## 2019-01-01 ENCOUNTER — Encounter: Payer: Self-pay | Admitting: Psychiatry

## 2019-01-01 ENCOUNTER — Other Ambulatory Visit: Payer: Self-pay

## 2019-01-01 ENCOUNTER — Ambulatory Visit (INDEPENDENT_AMBULATORY_CARE_PROVIDER_SITE_OTHER): Payer: PPO | Admitting: Psychiatry

## 2019-01-01 DIAGNOSIS — F3342 Major depressive disorder, recurrent, in full remission: Secondary | ICD-10-CM

## 2019-01-01 DIAGNOSIS — F5105 Insomnia due to other mental disorder: Secondary | ICD-10-CM | POA: Diagnosis not present

## 2019-01-01 MED ORDER — DULOXETINE HCL 30 MG PO CPEP: 90.0000 mg | ORAL_CAPSULE | Freq: Every day | ORAL | 1 refills | Status: DC

## 2019-01-01 MED ORDER — TRAZODONE HCL 100 MG PO TABS: 200.0000 mg | ORAL_TABLET | Freq: Every day | ORAL | 1 refills | Status: DC

## 2019-01-01 MED ORDER — ARIPIPRAZOLE 5 MG PO TABS: 5.0000 mg | ORAL_TABLET | Freq: Every day | ORAL | 1 refills | Status: DC

## 2019-01-01 MED ORDER — CLONAZEPAM 0.5 MG PO TABS: 0.5000 mg | ORAL_TABLET | Freq: Every day | ORAL | 1 refills | Status: DC

## 2019-01-01 NOTE — Progress Notes (Signed)
SION BEEKER [id number] 04-Jan-1946 73 y.o.  Subjective:   Patient ID:  Melody [name] is a 73 y.o. (DOB Dec 13, 1945) female.  Chief Complaint:  Chief Complaint  Patient presents with  . Follow-up    Medication Management  . Depression    Medication Management    HPI   Melody [name] presents to the office today for follow-up of depression and anxiety.   Last seen June 2020.  Her depressive symptoms had resolved with the addition of Abilify 2.5 mg in about December 2019.  We agreed she could try to discontinue that medication at her last visit.  She continue duloxetine without changes.  Stopped Abilify but kids notice the difference and she didn't.  Did OK for awhile without it and just restarted it lately and saw benefit right away.  Don't feel stressed but more trouble with sleep and needed to increase the trazodone and it's helped.  Tolerating meds.   Taking duloxetine 90, Abilify 5, trazodone 200 and clonazepam 0.5 mg nightly.  Off Abilify had depression with reduced laughter, less energy and motivation.  Feeling good now. Sleep good now.   She participated in [employer] trial.  Patient reports stable mood and denies depressed or irritable moods.  Patient denies any recent difficulty with anxiety.  Denies appetite disturbance.  Patient reports that energy and motivation have been good.   Patient denies any suicidal ideation.  Sleep fine overall.  Past Psychiatric Medication Trials: Abilify 5 blunted, duloxetine 120, trazodone 200, Prozac 40 mg, bupropion 300 mg, sertraline 200 mg, Effexor 225 mg, ProSom, Xanax, clonazepam, temazepam, Ambien, Paxil 30, lithium 450, Sonata, trazodone 200, Abilify 10 Under my psychiatric care since 1998  Review of Systems:  Review of Systems  Neurological: Negative for tremors and weakness.  Psychiatric/Behavioral: Negative for agitation, behavioral problems, confusion, decreased concentration, dysphoric mood, hallucinations, self-injury, sleep  disturbance and suicidal ideas. The patient is not nervous/anxious and is not hyperactive.     Medications: I have reviewed the patient's current medications.  Current Outpatient Medications  Medication Sig Dispense Refill  . Acetylcysteine 600 MG CAPS Take 600 mg by mouth daily.    Marland Kitchen aspirin 81 MG tablet Take 81 mg by mouth daily.      Marland Kitchen atorvastatin (LIPITOR) 20 MG tablet Take 20 mg by mouth at bedtime.     Marland Kitchen b complex vitamins tablet Take 1 tablet by mouth daily.      . benazepril (LOTENSIN) 20 MG tablet Take 20 mg by mouth daily.    . Calcium Carb-Cholecalciferol (CALCIUM-VITAMIN D3) 600-400 MG-UNIT TABS Take 1 tablet by mouth daily.    . Multiple Vitamins-Minerals (MULTIVITAMIN PO) Take 1 tablet by mouth at bedtime.    . Omega-3 Fatty Acids (FISH OIL) 1000 MG CAPS Take by mouth.    . valACYclovir (VALTREX) 500 MG tablet Take 500 mg by mouth daily.    . ARIPiprazole (ABILIFY) 5 MG tablet Take 1 tablet (5 mg total) by mouth daily. 90 tablet 1  . clonazePAM (KLONOPIN) 0.5 MG tablet Take 1 tablet (0.5 mg total) by mouth at bedtime. 90 tablet 1  . DULoxetine (CYMBALTA) 30 MG capsule Take 3 capsules (90 mg total) by mouth daily. 270 capsule 1  . traZODone (DESYREL) 100 MG tablet Take 2 tablets (200 mg total) by mouth at bedtime. 180 tablet 1   No current facility-administered medications for this visit.    Medication Side Effects: None  Allergies:  Allergies  Allergen Reactions  . Zolpidem  Tartrate Other (See Comments)    Talks on phone and won't remember doing so, etc.    Past Medical History:  Diagnosis Date  . Depression   . Hypertension     Family History  Problem Relation Age of Onset  . Heart disease Father     Social History   Socioeconomic History  . Marital status: Divorced    Spouse name: Not on file  . Number of children: Not on file  . Years of education: Not on file  . Highest education level: Not on file  Occupational History  . Not on file  Tobacco  Use  . Smoking status: Never Smoker  . Smokeless tobacco: Never Used  Substance and Sexual Activity  . Alcohol use: No  . Drug use: No  . Sexual activity: Not on file  Other Topics Concern  . Not on file  Social History Narrative  . Not on file   Social Determinants of Health   Financial Resource Strain:   . Difficulty of Paying Living Expenses: Not on file  Food Insecurity:   . Worried About Charity fundraiser in the Last Year: Not on file  . Ran Out of Food in the Last Year: Not on file  Transportation Needs:   . Lack of Transportation (Medical): Not on file  . Lack of Transportation (Non-Medical): Not on file  Physical Activity:   . Days of Exercise per Week: Not on file  . Minutes of Exercise per Session: Not on file  Stress:   . Feeling of Stress : Not on file  Social Connections:   . Frequency of Communication with Friends and Family: Not on file  . Frequency of Social Gatherings with Friends and Family: Not on file  . Attends Religious Services: Not on file  . Active Member of Clubs or Organizations: Not on file  . Attends Archivist Meetings: Not on file  . Marital Status: Not on file  Intimate Partner Violence:   . Fear of Current or Ex-Partner: Not on file  . Emotionally Abused: Not on file  . Physically Abused: Not on file  . Sexually Abused: Not on file    Past Medical History, Surgical history, Social history, and Family history were reviewed and updated as appropriate.   Please see review of systems for further details on the patient's review from today.   Objective:   Physical Exam:  There were no vitals taken for this visit.  Physical Exam Constitutional:      General: She is not in acute distress.    Appearance: She is well-developed.  Musculoskeletal:        General: No deformity.  Neurological:     Mental Status: She is alert and oriented to person, place, and time.     Coordination: Coordination normal.  Psychiatric:         Attention and Perception: Attention normal. She is attentive.        Mood and Affect: Mood normal. Mood is not anxious or depressed. Affect is not labile, blunt, angry or inappropriate.        Speech: Speech normal.        Behavior: Behavior normal.        Thought Content: Thought content normal. Thought content does not include homicidal or suicidal ideation.        Cognition and Memory: Cognition normal.        Judgment: Judgment normal.     Comments: Insight is good.  Lab Review:     Component Value Date/Time   NA 139 05/25/2017 1309   K 4.0 05/25/2017 1309   CL 103 05/25/2017 1309   CO2 30 05/25/2017 1309   GLUCOSE 92 05/25/2017 1309   BUN 16 05/25/2017 1309   CREATININE 0.84 05/25/2017 1309   CALCIUM 9.4 05/25/2017 1309   PROT 6.8 10/13/2010 1537   ALBUMIN 3.5 10/13/2010 1537   AST 23 10/13/2010 1537   ALT 19 10/13/2010 1537   ALKPHOS 61 10/13/2010 1537   BILITOT 0.2 (L) 10/13/2010 1537   GFRNONAA >60 05/25/2017 1309   GFRAA >60 05/25/2017 1309       Component Value Date/Time   WBC 6.8 05/25/2017 1309   RBC 4.15 05/25/2017 1309   HGB 12.8 05/25/2017 1309   HCT 39.1 05/25/2017 1309   PLT 201 05/25/2017 1309   MCV 94.2 05/25/2017 1309   MCH 30.8 05/25/2017 1309   MCHC 32.7 05/25/2017 1309   RDW 14.0 05/25/2017 1309   LYMPHSABS 2.5 10/13/2010 0316   MONOABS 0.7 10/13/2010 0316   EOSABS 0.2 10/13/2010 0316   BASOSABS 0.0 10/13/2010 0316    No results found for: POCLITH, LITHIUM   No results found for: PHENYTOIN, PHENOBARB, VALPROATE, CBMZ   .res Assessment: Plan:    Melody [name] was seen today for follow-up and depression.  Diagnoses and all orders for this visit:  Recurrent major depression in complete remission ([place]) -     ARIPiprazole (ABILIFY) 5 MG tablet; Take 1 tablet (5 mg total) by mouth daily. -     DULoxetine (CYMBALTA) 30 MG capsule; Take 3 capsules (90 mg total) by mouth daily.  Insomnia due to mental condition -     clonazePAM (KLONOPIN) 0.5  MG tablet; Take 1 tablet (0.5 mg total) by mouth at bedtime. -     traZODone (DESYREL) 100 MG tablet; Take 2 tablets (200 mg total) by mouth at bedtime.   The depression has resolved with the addition of Abilify 5 mg daily.  This is worked in the past also. She wants to try stopping it.  [place].  Call if relapse.  Options increase Abilify or duloxetine.  Discussed potential metabolic side effects associated with atypical antipsychotics, as well as potential risk for movement side effects. Advised pt to contact office if movement side effects occur.   Some of the cognitive problems or all of them could be related to the recent depression and may resolve on their own however, disc the off-label use of N-Acetylcysteine at 600 mg daily to help with mild cognitive problems.  She feels it's been helpful.  It can be combined with a B-complex vitamin as the B-12 and folate have been shown to sometimes enhance the effect.  No change indicated today.  FU 6 mos  Lynder Parents, MD, DFAPA  Please see After Visit Summary for patient specific instructions.  No future appointments.  No orders of the defined types were placed in this encounter.     -------------------------------

## 2019-01-08 DIAGNOSIS — I10 Essential (primary) hypertension: Secondary | ICD-10-CM | POA: Diagnosis not present

## 2019-01-08 DIAGNOSIS — F325 Major depressive disorder, single episode, in full remission: Secondary | ICD-10-CM | POA: Diagnosis not present

## 2019-01-08 DIAGNOSIS — M8580 Other specified disorders of bone density and structure, unspecified site: Secondary | ICD-10-CM | POA: Diagnosis not present

## 2019-01-08 DIAGNOSIS — E785 Hyperlipidemia, unspecified: Secondary | ICD-10-CM | POA: Diagnosis not present

## 2019-01-08 DIAGNOSIS — F3341 Major depressive disorder, recurrent, in partial remission: Secondary | ICD-10-CM | POA: Diagnosis not present

## 2019-01-24 DIAGNOSIS — E785 Hyperlipidemia, unspecified: Secondary | ICD-10-CM | POA: Diagnosis not present

## 2019-01-24 DIAGNOSIS — I10 Essential (primary) hypertension: Secondary | ICD-10-CM | POA: Diagnosis not present

## 2019-01-24 DIAGNOSIS — F3341 Major depressive disorder, recurrent, in partial remission: Secondary | ICD-10-CM | POA: Diagnosis not present

## 2019-02-11 DIAGNOSIS — E785 Hyperlipidemia, unspecified: Secondary | ICD-10-CM | POA: Diagnosis not present

## 2019-02-11 DIAGNOSIS — A609 Anogenital herpesviral infection, unspecified: Secondary | ICD-10-CM | POA: Diagnosis not present

## 2019-02-11 DIAGNOSIS — Z1211 Encounter for screening for malignant neoplasm of colon: Secondary | ICD-10-CM | POA: Diagnosis not present

## 2019-02-11 DIAGNOSIS — I10 Essential (primary) hypertension: Secondary | ICD-10-CM | POA: Diagnosis not present

## 2019-02-11 DIAGNOSIS — L900 Lichen sclerosus et atrophicus: Secondary | ICD-10-CM | POA: Diagnosis not present

## 2019-02-11 DIAGNOSIS — K625 Hemorrhage of anus and rectum: Secondary | ICD-10-CM | POA: Diagnosis not present

## 2019-02-11 DIAGNOSIS — R102 Pelvic and perineal pain: Secondary | ICD-10-CM | POA: Diagnosis not present

## 2019-02-11 DIAGNOSIS — Z0000 Encounter for general adult medical examination without abnormal findings: Secondary | ICD-10-CM | POA: Diagnosis not present

## 2019-02-11 DIAGNOSIS — E559 Vitamin D deficiency, unspecified: Secondary | ICD-10-CM | POA: Diagnosis not present

## 2019-02-11 DIAGNOSIS — F3341 Major depressive disorder, recurrent, in partial remission: Secondary | ICD-10-CM | POA: Diagnosis not present

## 2019-02-14 DIAGNOSIS — Z1231 Encounter for screening mammogram for malignant neoplasm of breast: Secondary | ICD-10-CM | POA: Diagnosis not present

## 2019-02-24 DIAGNOSIS — F3341 Major depressive disorder, recurrent, in partial remission: Secondary | ICD-10-CM | POA: Diagnosis not present

## 2019-02-24 DIAGNOSIS — I10 Essential (primary) hypertension: Secondary | ICD-10-CM | POA: Diagnosis not present

## 2019-02-24 DIAGNOSIS — E785 Hyperlipidemia, unspecified: Secondary | ICD-10-CM | POA: Diagnosis not present

## 2019-03-05 ENCOUNTER — Ambulatory Visit: Payer: PPO

## 2019-03-12 DIAGNOSIS — E875 Hyperkalemia: Secondary | ICD-10-CM | POA: Diagnosis not present

## 2019-03-13 DIAGNOSIS — Z8719 Personal history of other diseases of the digestive system: Secondary | ICD-10-CM | POA: Diagnosis not present

## 2019-03-13 DIAGNOSIS — Z1211 Encounter for screening for malignant neoplasm of colon: Secondary | ICD-10-CM | POA: Diagnosis not present

## 2019-03-19 DIAGNOSIS — M25511 Pain in right shoulder: Secondary | ICD-10-CM | POA: Diagnosis not present

## 2019-03-19 DIAGNOSIS — M25512 Pain in left shoulder: Secondary | ICD-10-CM | POA: Diagnosis not present

## 2019-03-24 DIAGNOSIS — Z1159 Encounter for screening for other viral diseases: Secondary | ICD-10-CM | POA: Diagnosis not present

## 2019-03-27 DIAGNOSIS — Q438 Other specified congenital malformations of intestine: Secondary | ICD-10-CM | POA: Diagnosis not present

## 2019-03-27 DIAGNOSIS — K5730 Diverticulosis of large intestine without perforation or abscess without bleeding: Secondary | ICD-10-CM | POA: Diagnosis not present

## 2019-03-27 DIAGNOSIS — D124 Benign neoplasm of descending colon: Secondary | ICD-10-CM | POA: Diagnosis not present

## 2019-03-27 DIAGNOSIS — K635 Polyp of colon: Secondary | ICD-10-CM | POA: Diagnosis not present

## 2019-03-27 DIAGNOSIS — Z1211 Encounter for screening for malignant neoplasm of colon: Secondary | ICD-10-CM | POA: Diagnosis not present

## 2019-03-27 DIAGNOSIS — D122 Benign neoplasm of ascending colon: Secondary | ICD-10-CM | POA: Diagnosis not present

## 2019-04-01 DIAGNOSIS — K635 Polyp of colon: Secondary | ICD-10-CM | POA: Diagnosis not present

## 2019-04-01 DIAGNOSIS — D124 Benign neoplasm of descending colon: Secondary | ICD-10-CM | POA: Diagnosis not present

## 2019-04-01 DIAGNOSIS — D122 Benign neoplasm of ascending colon: Secondary | ICD-10-CM | POA: Diagnosis not present

## 2019-04-16 DIAGNOSIS — M7542 Impingement syndrome of left shoulder: Secondary | ICD-10-CM | POA: Diagnosis not present

## 2019-04-16 DIAGNOSIS — M7541 Impingement syndrome of right shoulder: Secondary | ICD-10-CM | POA: Diagnosis not present

## 2019-05-18 ENCOUNTER — Other Ambulatory Visit: Payer: Self-pay | Admitting: Psychiatry

## 2019-05-18 DIAGNOSIS — F3342 Major depressive disorder, recurrent, in full remission: Secondary | ICD-10-CM

## 2019-05-18 DIAGNOSIS — F5105 Insomnia due to other mental disorder: Secondary | ICD-10-CM

## 2019-05-28 DIAGNOSIS — M7542 Impingement syndrome of left shoulder: Secondary | ICD-10-CM | POA: Diagnosis not present

## 2019-05-28 DIAGNOSIS — E785 Hyperlipidemia, unspecified: Secondary | ICD-10-CM | POA: Diagnosis not present

## 2019-05-28 DIAGNOSIS — F3341 Major depressive disorder, recurrent, in partial remission: Secondary | ICD-10-CM | POA: Diagnosis not present

## 2019-05-28 DIAGNOSIS — M25511 Pain in right shoulder: Secondary | ICD-10-CM | POA: Diagnosis not present

## 2019-05-28 DIAGNOSIS — M7541 Impingement syndrome of right shoulder: Secondary | ICD-10-CM | POA: Diagnosis not present

## 2019-05-28 DIAGNOSIS — I10 Essential (primary) hypertension: Secondary | ICD-10-CM | POA: Diagnosis not present

## 2019-06-07 DIAGNOSIS — M25511 Pain in right shoulder: Secondary | ICD-10-CM | POA: Diagnosis not present

## 2019-06-16 DIAGNOSIS — M7541 Impingement syndrome of right shoulder: Secondary | ICD-10-CM | POA: Diagnosis not present

## 2019-06-19 DIAGNOSIS — I10 Essential (primary) hypertension: Secondary | ICD-10-CM | POA: Diagnosis not present

## 2019-06-19 DIAGNOSIS — E785 Hyperlipidemia, unspecified: Secondary | ICD-10-CM | POA: Diagnosis not present

## 2019-06-19 DIAGNOSIS — F3341 Major depressive disorder, recurrent, in partial remission: Secondary | ICD-10-CM | POA: Diagnosis not present

## 2019-06-23 ENCOUNTER — Ambulatory Visit (INDEPENDENT_AMBULATORY_CARE_PROVIDER_SITE_OTHER): Payer: PPO | Admitting: Psychiatry

## 2019-06-23 ENCOUNTER — Other Ambulatory Visit: Payer: Self-pay

## 2019-06-23 ENCOUNTER — Encounter: Payer: Self-pay | Admitting: Psychiatry

## 2019-06-23 DIAGNOSIS — F5105 Insomnia due to other mental disorder: Secondary | ICD-10-CM

## 2019-06-23 DIAGNOSIS — F331 Major depressive disorder, recurrent, moderate: Secondary | ICD-10-CM | POA: Diagnosis not present

## 2019-06-23 DIAGNOSIS — F3342 Major depressive disorder, recurrent, in full remission: Secondary | ICD-10-CM | POA: Diagnosis not present

## 2019-06-23 MED ORDER — CLONAZEPAM 0.5 MG PO TABS: 0.5000 mg | ORAL_TABLET | Freq: Every day | ORAL | 1 refills | Status: DC

## 2019-06-23 MED ORDER — ARIPIPRAZOLE 5 MG PO TABS: 5.0000 mg | ORAL_TABLET | Freq: Every day | ORAL | 1 refills | Status: DC

## 2019-06-23 MED ORDER — DULOXETINE HCL 30 MG PO CPEP: 90.0000 mg | ORAL_CAPSULE | Freq: Every day | ORAL | 1 refills | Status: DC

## 2019-06-23 MED ORDER — TRAZODONE HCL 100 MG PO TABS: 200.0000 mg | ORAL_TABLET | Freq: Every day | ORAL | 1 refills | Status: DC

## 2019-06-23 NOTE — Progress Notes (Signed)
Melody [name] [id number] 07-28-1945 74 y.o.  Subjective:   Patient ID:  Melody [name] is a 74 y.o. (DOB January 26, 1945) female.  Chief Complaint:  Chief Complaint  Patient presents with  . Follow-up  . Depression    HPI   Melody [name] presents to the office today for follow-up of depression and anxiety.   seen June 2020.  Her depressive symptoms had resolved with the addition of Abilify 2.5 mg in about December 2019.  We agreed she could try to discontinue that medication at her last visit.  She continue duloxetine without changes.  Last seen December 2020.  Stopped Abilify but kids notice the difference and she didn't.  Did OK for awhile without it and just restarted it lately and saw benefit right away.  Don't feel stressed but more trouble with sleep and needed to increase the trazodone and it's helped.  Tolerating meds.   Taking duloxetine 90, Abilify 5, trazodone 200 and clonazepam 0.5 mg nightly. Off Abilify had depression with reduced laughter, less energy and motivation.  Feeling good now. Sleep good now.  She participated in [employer] trial. Restarted Abilify in December.  06/23/19 appt doing fine.  Seen with Melody [name].   Depression has remained under control.  A little more difficulty if nothing to do.  Helps care for 2 Melody's.  More trouble with depression at those times.  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    Patient reports stable mood and denies depressed or irritable moods.  Patient denies any recent difficulty with anxiety.  Denies appetite disturbance.  Patient reports that energy and motivation have been good.   Patient denies any suicidal ideation.  Sleep fine overall.  Past Psychiatric Medication Trials: Abilify 5 blunted, duloxetine 120, trazodone 200, Prozac 40 mg, bupropion 300 mg, sertraline 200 mg, Effexor 225 mg, ProSom, Xanax, clonazepam, temazepam, Ambien, Paxil 30, lithium 450, Sonata, trazodone 200, Abilify 10 Under my psychiatric care since  1998  Review of Systems:  Review of Systems  Musculoskeletal: Positive for arthralgias.  Neurological: Negative for tremors and weakness.  Psychiatric/Behavioral: Negative for agitation, behavioral problems, confusion, decreased concentration, dysphoric mood, hallucinations, self-injury, sleep disturbance and suicidal ideas. The patient is not nervous/anxious and is not hyperactive.     Medications: I have reviewed the patient's current medications.  Current Outpatient Medications  Medication Sig Dispense Refill  . Acetylcysteine 600 MG CAPS Take 600 mg by mouth daily.    . ARIPiprazole (ABILIFY) 5 MG tablet Take 1 tablet (5 mg total) by mouth daily. 90 tablet 1  . aspirin 81 MG tablet Take 81 mg by mouth daily.      Marland Kitchen atorvastatin (LIPITOR) 20 MG tablet Take 20 mg by mouth at bedtime.     Marland Kitchen b complex vitamins tablet Take 1 tablet by mouth daily.      . benazepril (LOTENSIN) 20 MG tablet Take 20 mg by mouth daily.    . Calcium Carb-Cholecalciferol (CALCIUM-VITAMIN D3) 600-400 MG-UNIT TABS Take 1 tablet by mouth daily.    . clonazePAM (KLONOPIN) 0.5 MG tablet Take 1 tablet (0.5 mg total) by mouth at bedtime. 90 tablet 1  . DULoxetine (CYMBALTA) 30 MG capsule Take 3 capsules (90 mg total) by mouth daily. 270 capsule 1  . Multiple Vitamins-Minerals (MULTIVITAMIN PO) Take 1 tablet by mouth at bedtime.    . Omega-3 Fatty Acids (FISH OIL) 1000 MG CAPS Take by mouth.    . traZODone (DESYREL) 100 MG tablet Take 2 tablets (200  mg total) by mouth at bedtime. 180 tablet 1  . valACYclovir (VALTREX) 500 MG tablet Take 500 mg by mouth daily.     No current facility-administered medications for this visit.    Medication Side Effects: None  Allergies:  Allergies  Allergen Reactions  . Zolpidem Tartrate Other (See Comments)    Talks on phone and won't remember doing so, etc.    Past Medical History:  Diagnosis Date  . Depression   . Hypertension     Family History  Problem Relation Age  of Onset  . Heart disease Father     Social History   Socioeconomic History  . Marital status: Divorced    Spouse name: Not on file  . Number of children: Not on file  . Years of education: Not on file  . Highest education level: Not on file  Occupational History  . Not on file  Tobacco Use  . Smoking status: Never Smoker  . Smokeless tobacco: Never Used  Vaping Use  . Vaping Use: Never used  Substance and Sexual Activity  . Alcohol use: No  . Drug use: No  . Sexual activity: Not on file  Other Topics Concern  . Not on file  Social History Narrative  . Not on file   Social Determinants of Health   Financial Resource Strain:   . Difficulty of Paying Living Expenses:   Food Insecurity:   . Worried About Charity fundraiser in the Last Year:   . Arboriculturist in the Last Year:   Transportation Needs:   . Film/video editor (Medical):   Marland Kitchen Lack of Transportation (Non-Medical):   Physical Activity:   . Days of Exercise per Week:   . Minutes of Exercise per Session:   Stress:   . Feeling of Stress :   Social Connections:   . Frequency of Communication with Friends and Family:   . Frequency of Social Gatherings with Friends and Family:   . Attends Religious Services:   . Active Member of Clubs or Organizations:   . Attends Archivist Meetings:   Marland Kitchen Marital Status:   Intimate Partner Violence:   . Fear of Current or Ex-Partner:   . Emotionally Abused:   Marland Kitchen Physically Abused:   . Sexually Abused:     Past Medical History, Surgical history, Social history, and Family history were reviewed and updated as appropriate.   Please see review of systems for further details on the patient's review from today.   Objective:   Physical Exam:  There were no vitals taken for this visit.  Physical Exam Constitutional:      General: She is not in acute distress.    Appearance: She is well-developed.  Musculoskeletal:        General: No deformity.   Neurological:     Mental Status: She is alert and oriented to person, place, and time.     Coordination: Coordination normal.  Psychiatric:        Attention and Perception: Attention normal. She is attentive.        Mood and Affect: Mood normal. Mood is not anxious or depressed. Affect is not labile, blunt, angry or inappropriate.        Speech: Speech normal.        Behavior: Behavior normal.        Thought Content: Thought content normal. Thought content does not include homicidal or suicidal ideation.        Cognition  and Memory: Cognition normal.        Judgment: Judgment normal.     Comments: Insight is good. Mildly constricted.     Lab Review:     Component Value Date/Time   NA 139 05/25/2017 1309   K 4.0 05/25/2017 1309   CL 103 05/25/2017 1309   CO2 30 05/25/2017 1309   GLUCOSE 92 05/25/2017 1309   BUN 16 05/25/2017 1309   CREATININE 0.84 05/25/2017 1309   CALCIUM 9.4 05/25/2017 1309   PROT 6.8 10/13/2010 1537   ALBUMIN 3.5 10/13/2010 1537   AST 23 10/13/2010 1537   ALT 19 10/13/2010 1537   ALKPHOS 61 10/13/2010 1537   BILITOT 0.2 (L) 10/13/2010 1537   GFRNONAA >60 05/25/2017 1309   GFRAA >60 05/25/2017 1309       Component Value Date/Time   WBC 6.8 05/25/2017 1309   RBC 4.15 05/25/2017 1309   HGB 12.8 05/25/2017 1309   HCT 39.1 05/25/2017 1309   PLT 201 05/25/2017 1309   MCV 94.2 05/25/2017 1309   MCH 30.8 05/25/2017 1309   MCHC 32.7 05/25/2017 1309   RDW 14.0 05/25/2017 1309   LYMPHSABS 2.5 10/13/2010 0316   MONOABS 0.7 10/13/2010 0316   EOSABS 0.2 10/13/2010 0316   BASOSABS 0.0 10/13/2010 0316    No results found for: POCLITH, LITHIUM   No results found for: PHENYTOIN, PHENOBARB, VALPROATE, CBMZ   .res Assessment: Plan:    Melody [name] was seen today for follow-up and depression.  Diagnoses and all orders for this visit:  Recurrent major depression in complete remission ([place]) -     DULoxetine (CYMBALTA) 30 MG capsule; Take 3 capsules (90 mg  total) by mouth daily. -     ARIPiprazole (ABILIFY) 5 MG tablet; Take 1 tablet (5 mg total) by mouth daily.  Major depressive disorder, recurrent episode, moderate ([facility])  Insomnia due to mental condition -     traZODone (DESYREL) 100 MG tablet; Take 2 tablets (200 mg total) by mouth at bedtime. -     clonazePAM (KLONOPIN) 0.5 MG tablet; Take 1 tablet (0.5 mg total) by mouth at bedtime.   The depression has resolved with the addition of Abilify 5 mg daily.  Failed attempt to stop it.  Supportive therapy on need to stay active to fight depression.  Discussed potential metabolic side effects associated with atypical antipsychotics, as well as potential risk for movement side effects. Advised pt to contact office if movement side effects occur.   Some of the cognitive problems or all of them could be related to the recent depression and may resolve on their own however, disc the off-label use of N-Acetylcysteine at 600 mg daily to help with mild cognitive problems.  She feels it's been helpful.  It can be combined with a B-complex vitamin as the B-12 and folate have been shown to sometimes enhance the effect.  No change indicated today.  FU 6 mos  Lynder Parents, MD, DFAPA  Please see After Visit Summary for patient specific instructions.  No future appointments.  No orders of the defined types were placed in this encounter.     -------------------------------

## 2019-07-11 DIAGNOSIS — I10 Essential (primary) hypertension: Secondary | ICD-10-CM | POA: Diagnosis not present

## 2019-07-23 DIAGNOSIS — F3341 Major depressive disorder, recurrent, in partial remission: Secondary | ICD-10-CM | POA: Diagnosis not present

## 2019-07-23 DIAGNOSIS — I10 Essential (primary) hypertension: Secondary | ICD-10-CM | POA: Diagnosis not present

## 2019-07-23 DIAGNOSIS — E785 Hyperlipidemia, unspecified: Secondary | ICD-10-CM | POA: Diagnosis not present

## 2019-08-17 DIAGNOSIS — Z20828 Contact with and (suspected) exposure to other viral communicable diseases: Secondary | ICD-10-CM | POA: Diagnosis not present

## 2019-09-01 DIAGNOSIS — H524 Presbyopia: Secondary | ICD-10-CM | POA: Diagnosis not present

## 2019-09-01 DIAGNOSIS — H2513 Age-related nuclear cataract, bilateral: Secondary | ICD-10-CM | POA: Diagnosis not present

## 2019-09-04 DIAGNOSIS — F3341 Major depressive disorder, recurrent, in partial remission: Secondary | ICD-10-CM | POA: Diagnosis not present

## 2019-09-04 DIAGNOSIS — E785 Hyperlipidemia, unspecified: Secondary | ICD-10-CM | POA: Diagnosis not present

## 2019-09-04 DIAGNOSIS — I10 Essential (primary) hypertension: Secondary | ICD-10-CM | POA: Diagnosis not present

## 2019-10-09 DIAGNOSIS — R55 Syncope and collapse: Secondary | ICD-10-CM | POA: Diagnosis not present

## 2019-10-09 DIAGNOSIS — I10 Essential (primary) hypertension: Secondary | ICD-10-CM | POA: Diagnosis not present

## 2019-10-13 DIAGNOSIS — F3341 Major depressive disorder, recurrent, in partial remission: Secondary | ICD-10-CM | POA: Diagnosis not present

## 2019-10-13 DIAGNOSIS — I10 Essential (primary) hypertension: Secondary | ICD-10-CM | POA: Diagnosis not present

## 2019-10-13 DIAGNOSIS — E785 Hyperlipidemia, unspecified: Secondary | ICD-10-CM | POA: Diagnosis not present

## 2019-11-05 DIAGNOSIS — Z23 Encounter for immunization: Secondary | ICD-10-CM | POA: Diagnosis not present

## 2019-11-10 DIAGNOSIS — I10 Essential (primary) hypertension: Secondary | ICD-10-CM | POA: Diagnosis not present

## 2019-11-10 DIAGNOSIS — F3341 Major depressive disorder, recurrent, in partial remission: Secondary | ICD-10-CM | POA: Diagnosis not present

## 2019-11-10 DIAGNOSIS — E785 Hyperlipidemia, unspecified: Secondary | ICD-10-CM | POA: Diagnosis not present

## 2019-11-12 DIAGNOSIS — Z86018 Personal history of other benign neoplasm: Secondary | ICD-10-CM | POA: Diagnosis not present

## 2019-11-12 DIAGNOSIS — D225 Melanocytic nevi of trunk: Secondary | ICD-10-CM | POA: Diagnosis not present

## 2019-11-12 DIAGNOSIS — L578 Other skin changes due to chronic exposure to nonionizing radiation: Secondary | ICD-10-CM | POA: Diagnosis not present

## 2019-11-12 DIAGNOSIS — L814 Other melanin hyperpigmentation: Secondary | ICD-10-CM | POA: Diagnosis not present

## 2019-11-12 DIAGNOSIS — D2271 Melanocytic nevi of right lower limb, including hip: Secondary | ICD-10-CM | POA: Diagnosis not present

## 2019-11-12 DIAGNOSIS — D2272 Melanocytic nevi of left lower limb, including hip: Secondary | ICD-10-CM | POA: Diagnosis not present

## 2019-11-12 DIAGNOSIS — L821 Other seborrheic keratosis: Secondary | ICD-10-CM | POA: Diagnosis not present

## 2019-12-11 DIAGNOSIS — F3341 Major depressive disorder, recurrent, in partial remission: Secondary | ICD-10-CM | POA: Diagnosis not present

## 2019-12-11 DIAGNOSIS — I10 Essential (primary) hypertension: Secondary | ICD-10-CM | POA: Diagnosis not present

## 2019-12-11 DIAGNOSIS — E785 Hyperlipidemia, unspecified: Secondary | ICD-10-CM | POA: Diagnosis not present

## 2019-12-17 ENCOUNTER — Other Ambulatory Visit: Payer: Self-pay

## 2019-12-17 ENCOUNTER — Encounter: Payer: Self-pay | Admitting: Psychiatry

## 2019-12-17 ENCOUNTER — Ambulatory Visit (INDEPENDENT_AMBULATORY_CARE_PROVIDER_SITE_OTHER): Payer: PPO | Admitting: Psychiatry

## 2019-12-17 DIAGNOSIS — F3342 Major depressive disorder, recurrent, in full remission: Secondary | ICD-10-CM

## 2019-12-17 DIAGNOSIS — F5105 Insomnia due to other mental disorder: Secondary | ICD-10-CM | POA: Diagnosis not present

## 2019-12-17 DIAGNOSIS — R5383 Other fatigue: Secondary | ICD-10-CM

## 2019-12-17 MED ORDER — DULOXETINE HCL 30 MG PO CPEP: 90.0000 mg | ORAL_CAPSULE | Freq: Every day | ORAL | 1 refills | Status: DC

## 2019-12-17 MED ORDER — TRAZODONE HCL 100 MG PO TABS: 200.0000 mg | ORAL_TABLET | Freq: Every day | ORAL | 1 refills | Status: DC

## 2019-12-17 MED ORDER — ARIPIPRAZOLE 10 MG PO TABS: 5.0000 mg | ORAL_TABLET | Freq: Every day | ORAL | 0 refills | Status: DC

## 2019-12-17 MED ORDER — CLONAZEPAM 0.5 MG PO TABS: 0.5000 mg | ORAL_TABLET | Freq: Every day | ORAL | 1 refills | Status: DC

## 2019-12-17 NOTE — Progress Notes (Signed)
Melody [name] [id number] Oct 03, 1945 74 y.o.  Subjective:   Patient ID:  Melody [name] is a 74 y.o. (DOB [birth year]) female.  Chief Complaint:  Chief Complaint  Patient presents with  . Follow-up  . Anxiety  . Depression  . Sleeping Problem    HPI   Melody [name] presents to the office today for follow-up of depression and anxiety.   seen June 2020.  Her depressive symptoms had resolved with the addition of Abilify 2.5 mg in about December 2019.  We agreed she could try to discontinue that medication at her last visit.  She continue duloxetine without changes.  Last seen December 2020.  Stopped Abilify but kids notice the difference and she didn't.  Did OK for awhile without it and just restarted it lately and saw benefit right away.  Don't feel stressed but more trouble with sleep and needed to increase the trazodone and it's helped.  Tolerating meds.   Taking duloxetine 90, Abilify 5, trazodone 200 and clonazepam 0.5 mg nightly. Off Abilify had depression with reduced laughter, less energy and motivation.  Feeling good now. Sleep good now.  She participated in [employer] trial. Restarted Abilify in December.  06/23/19 appt doing fine.  Seen with Melody [name].   Depression has remained under control.  A little more difficulty if nothing to do.  Helps care for 2 Melody's.  More trouble with depression at those times.  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   Plan: No med changes  12/17/2019 appointment with the following noted: PCP Dr. Harlan Stains wanted her to stop caffeine.  Finally stopped it 3 weeks ago but is running exhausted.  Says Dr. Burnard Bunting maybe Adderall.  Feels like she could go to sleep.  Feels less sharp off caffeine. Was taking 200 mg BID.    Patient reports stable mood and denies depressed or irritable moods.  Patient denies any recent difficulty with anxiety.  Denies appetite disturbance.    Patient denies any suicidal ideation.  Sleep fine overall.  Past  Psychiatric Medication Trials: Abilify 5 blunted, duloxetine 120, trazodone 200, Prozac 40 mg, bupropion 300 mg, sertraline 200 mg, Effexor 225 mg, ProSom, Xanax, clonazepam, temazepam, Ambien, Paxil 30, lithium 450, Sonata, trazodone 200, Abilify 10 Under my psychiatric care since 1998  Review of Systems:  Review of Systems  Constitutional: Positive for fatigue.  Musculoskeletal: Positive for arthralgias.  Neurological: Negative for tremors and weakness.  Psychiatric/Behavioral: Negative for agitation, behavioral problems, confusion, decreased concentration, dysphoric mood, hallucinations, self-injury, sleep disturbance and suicidal ideas. The patient is not nervous/anxious and is not hyperactive.     Medications: I have reviewed the patient's current medications.  Current Outpatient Medications  Medication Sig Dispense Refill  . Acetylcysteine 600 MG CAPS Take 600 mg by mouth daily.    . ARIPiprazole (ABILIFY) 10 MG tablet Take 0.5 tablets (5 mg total) by mouth daily. 90 tablet 0  . aspirin 81 MG tablet Take 81 mg by mouth daily.      Marland Kitchen atorvastatin (LIPITOR) 20 MG tablet Take 20 mg by mouth at bedtime.     Marland Kitchen b complex vitamins tablet Take 1 tablet by mouth daily.      . benazepril (LOTENSIN) 20 MG tablet Take 20 mg by mouth daily.    . Calcium Carb-Cholecalciferol (CALCIUM-VITAMIN D3) 600-400 MG-UNIT TABS Take 1 tablet by mouth daily.    . clonazePAM (KLONOPIN) 0.5 MG tablet Take 1 tablet (0.5 mg total) by mouth at bedtime. 90 tablet  1  . DULoxetine (CYMBALTA) 30 MG capsule Take 3 capsules (90 mg total) by mouth daily. 270 capsule 1  . Multiple Vitamins-Minerals (MULTIVITAMIN PO) Take 1 tablet by mouth at bedtime.    . Omega-3 Fatty Acids (FISH OIL) 1000 MG CAPS Take by mouth.    . traZODone (DESYREL) 100 MG tablet Take 2 tablets (200 mg total) by mouth at bedtime. 180 tablet 1  . valACYclovir (VALTREX) 500 MG tablet Take 500 mg by mouth daily.     No current facility-administered  medications for this visit.    Medication Side Effects: None  Allergies:  Allergies  Allergen Reactions  . Zolpidem Tartrate Other (See Comments)    Talks on phone and won't remember doing so, etc.    Past Medical History:  Diagnosis Date  . Depression   . Hypertension     Family History  Problem Relation Age of Onset  . Heart disease Father     Social History   Socioeconomic History  . Marital status: Divorced    Spouse name: Not on file  . Number of children: Not on file  . Years of education: Not on file  . Highest education level: Not on file  Occupational History  . Not on file  Tobacco Use  . Smoking status: Never Smoker  . Smokeless tobacco: Never Used  Vaping Use  . Vaping Use: Never used  Substance and Sexual Activity  . Alcohol use: No  . Drug use: No  . Sexual activity: Not on file  Other Topics Concern  . Not on file  Social History Narrative  . Not on file   Social Determinants of Health   Financial Resource Strain:   . Difficulty of Paying Living Expenses: Not on file  Food Insecurity:   . Worried About Charity fundraiser in the Last Year: Not on file  . Ran Out of Food in the Last Year: Not on file  Transportation Needs:   . Lack of Transportation (Medical): Not on file  . Lack of Transportation (Non-Medical): Not on file  Physical Activity:   . Days of Exercise per Week: Not on file  . Minutes of Exercise per Session: Not on file  Stress:   . Feeling of Stress : Not on file  Social Connections:   . Frequency of Communication with Friends and Family: Not on file  . Frequency of Social Gatherings with Friends and Family: Not on file  . Attends Religious Services: Not on file  . Active Member of Clubs or Organizations: Not on file  . Attends Archivist Meetings: Not on file  . Marital Status: Not on file  Intimate Partner Violence:   . Fear of Current or Ex-Partner: Not on file  . Emotionally Abused: Not on file  .  Physically Abused: Not on file  . Sexually Abused: Not on file    Past Medical History, Surgical history, Social history, and Family history were reviewed and updated as appropriate.   Please see review of systems for further details on the patient's review from today.   Objective:   Physical Exam:  There were no vitals taken for this visit.  Physical Exam Constitutional:      General: She is not in acute distress.    Appearance: She is well-developed.  Musculoskeletal:        General: No deformity.  Neurological:     Mental Status: She is alert and oriented to person, place, and time.  Coordination: Coordination normal.  Psychiatric:        Attention and Perception: Attention normal. She is attentive.        Mood and Affect: Mood normal. Mood is not anxious or depressed. Affect is not labile, blunt, angry, tearful or inappropriate.        Speech: Speech normal.        Behavior: Behavior normal.        Thought Content: Thought content normal. Thought content does not include homicidal or suicidal ideation.        Cognition and Memory: Cognition normal.        Judgment: Judgment normal.     Comments: Insight is good. Mildly constricted.     Lab Review:     Component Value Date/Time   NA 139 05/25/2017 1309   K 4.0 05/25/2017 1309   CL 103 05/25/2017 1309   CO2 30 05/25/2017 1309   GLUCOSE 92 05/25/2017 1309   BUN 16 05/25/2017 1309   CREATININE 0.84 05/25/2017 1309   CALCIUM 9.4 05/25/2017 1309   PROT 6.8 10/13/2010 1537   ALBUMIN 3.5 10/13/2010 1537   AST 23 10/13/2010 1537   ALT 19 10/13/2010 1537   ALKPHOS 61 10/13/2010 1537   BILITOT 0.2 (L) 10/13/2010 1537   GFRNONAA >60 05/25/2017 1309   GFRAA >60 05/25/2017 1309       Component Value Date/Time   WBC 6.8 05/25/2017 1309   RBC 4.15 05/25/2017 1309   HGB 12.8 05/25/2017 1309   HCT 39.1 05/25/2017 1309   PLT 201 05/25/2017 1309   MCV 94.2 05/25/2017 1309   MCH 30.8 05/25/2017 1309   MCHC 32.7  05/25/2017 1309   RDW 14.0 05/25/2017 1309   LYMPHSABS 2.5 10/13/2010 0316   MONOABS 0.7 10/13/2010 0316   EOSABS 0.2 10/13/2010 0316   BASOSABS 0.0 10/13/2010 0316    No results found for: POCLITH, LITHIUM   No results found for: PHENYTOIN, PHENOBARB, VALPROATE, CBMZ   .res Assessment: Plan:    Melody [name] was seen today for follow-up, anxiety, depression and sleeping problem.  Diagnoses and all orders for this visit:  Recurrent major depression in complete remission ([place]) -     ARIPiprazole (ABILIFY) 10 MG tablet; Take 0.5 tablets (5 mg total) by mouth daily. -     DULoxetine (CYMBALTA) 30 MG capsule; Take 3 capsules (90 mg total) by mouth daily.  Insomnia due to mental condition -     clonazePAM (KLONOPIN) 0.5 MG tablet; Take 1 tablet (0.5 mg total) by mouth at bedtime. -     traZODone (DESYREL) 100 MG tablet; Take 2 tablets (200 mg total) by mouth at bedtime.  Fatigue, unspecified type   The depression has resolved with the addition of Abilify 5 mg daily.  Failed attempt to stop it.  Supportive therapy on need to stay active to fight depression.  Discussed potential metabolic side effects associated with atypical antipsychotics, as well as potential risk for movement side effects. Advised pt to contact office if movement side effects occur.   Extensive discussion about caffeine use.  She stopped it for 3 weeks and still having excessive sleepiness and fogginess.  She says that caffeine solve that problem and does not appear to worsen her sleep.  Discussed her doctor's questions about the caffeine use.  It would not seem logical to switch to a traditional stimulant as opposed to caffeine.  Discussed the other option of modafinil.  She would rather just return to the caffeine use to  solve the problem. Would suggest before doing so to cut the trazodone dose in half before resuming the caffeine to ensure that the trazodone is not hanging over in causing the fogginess.  Also pay  attention when she resumes caffeine that her sleep is not adversely affected.  No change indicated today.  FU 6 mos  Lynder Parents, MD, DFAPA  Please see After Visit Summary for patient specific instructions.  Future Appointments  Date Time Provider [place]  06/16/2020  1:00 PM Cottle, Billey Co., MD CP-CP None    No orders of the defined types were placed in this encounter.     -------------------------------

## 2019-12-24 ENCOUNTER — Ambulatory Visit: Payer: PPO | Admitting: Psychiatry

## 2020-01-09 IMAGING — DX DG HIP (WITH OR WITHOUT PELVIS) 2-3V*R*
2 series · 2 of 2 positions shown · non-contrast
Comparison: None.

CLINICAL DATA: Rt hip pain since mid October 2016, NKI.

EXAM:
DG HIP (WITH OR WITHOUT PELVIS) 2-3V RIGHT

[hip ap]
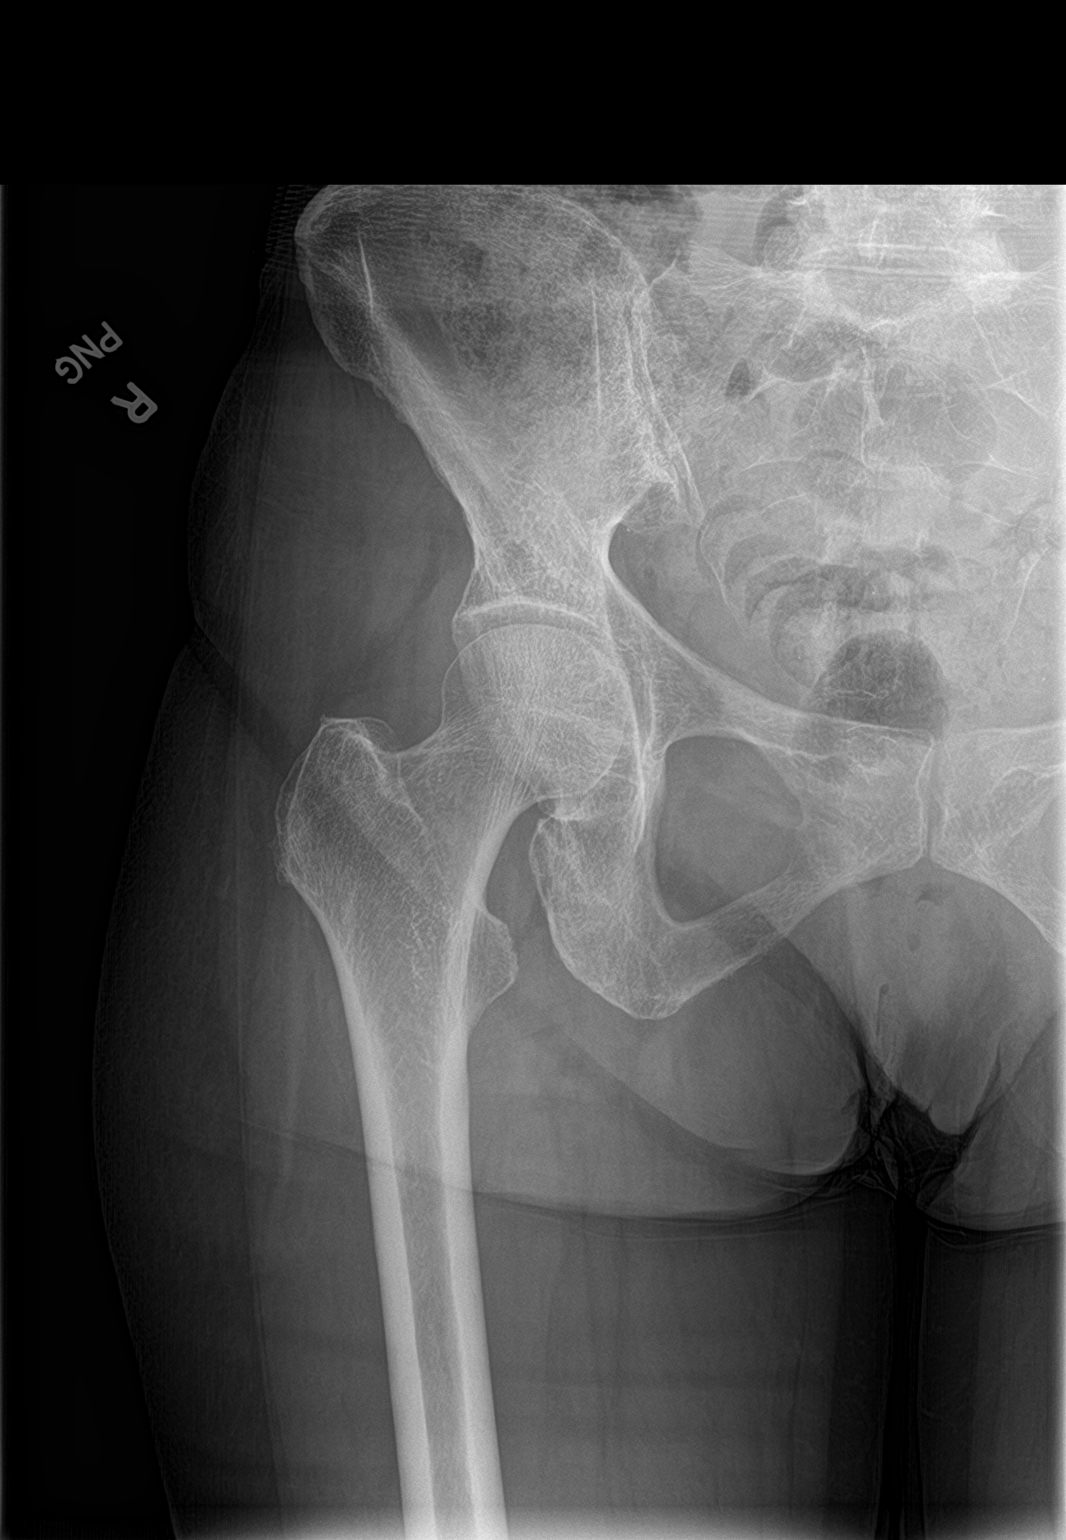

[hip lat]
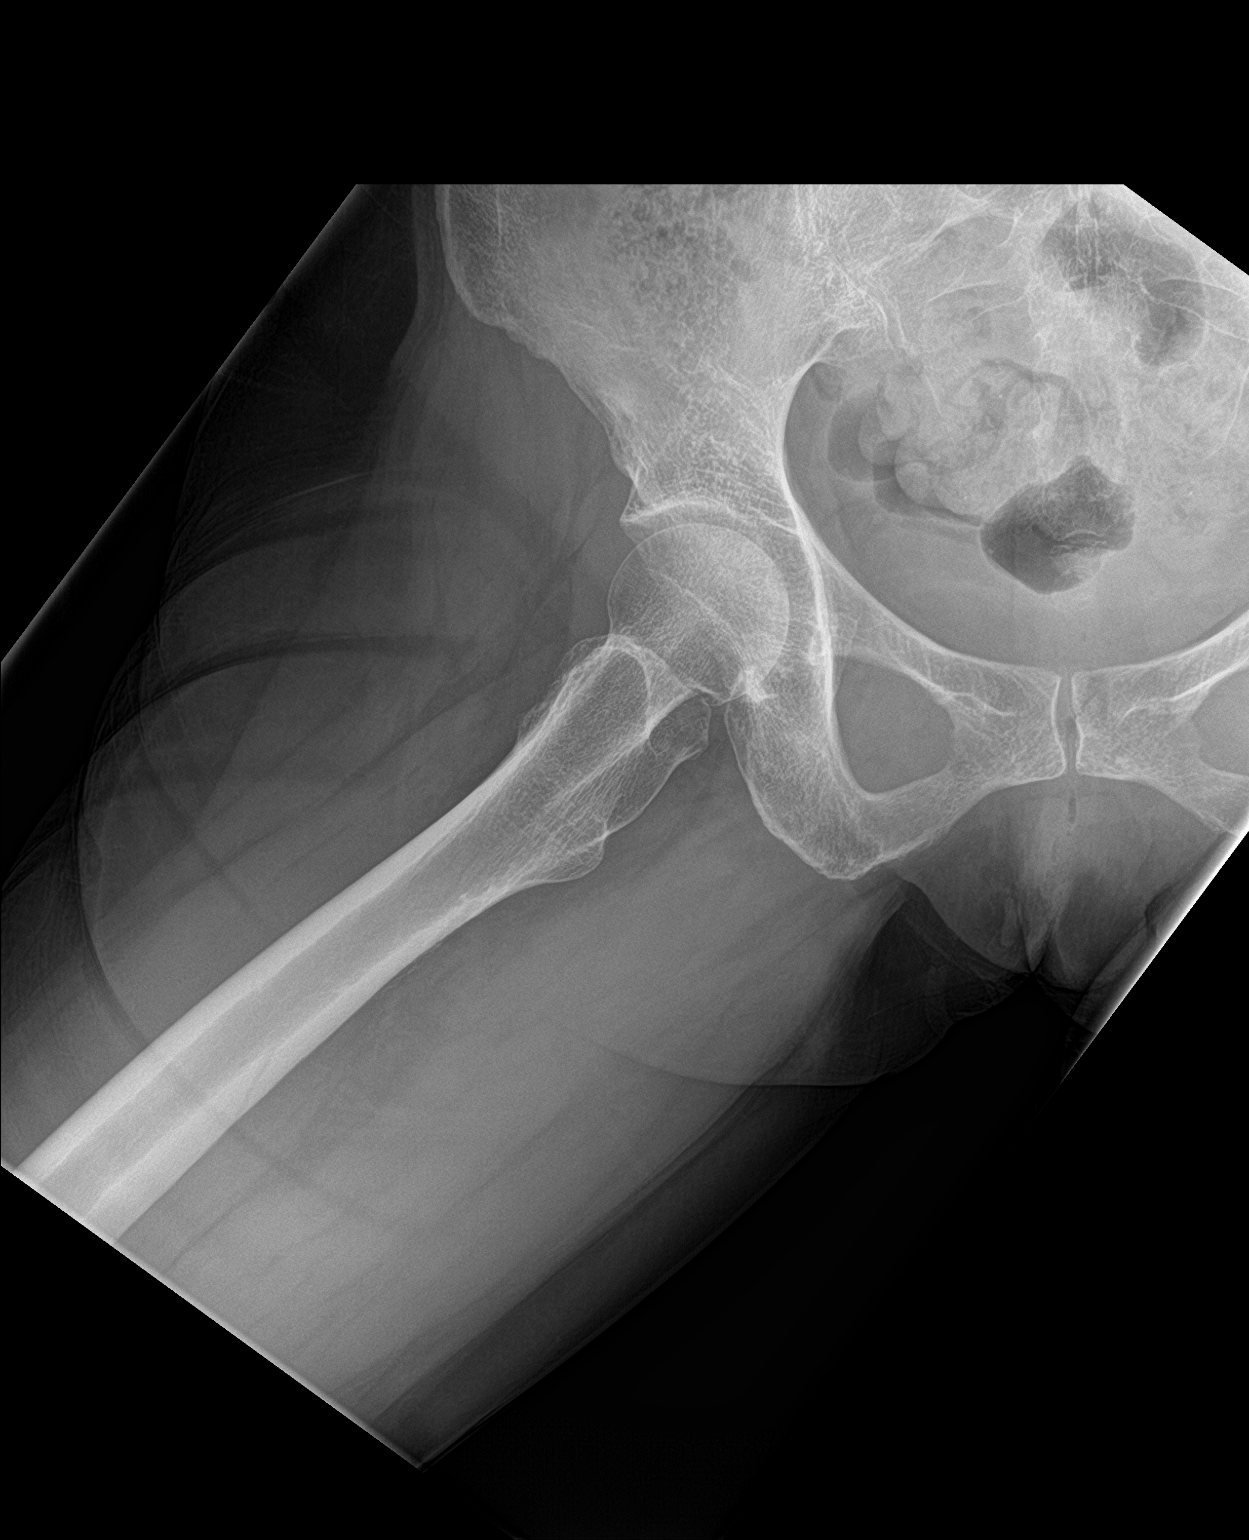

[2 of 2 positions shown; findings below may reference images not displayed]

FINDINGS: There is no evidence of hip fracture or dislocation. There is no
evidence of arthropathy or other focal bone abnormality.
IMPRESSION: Negative.

## 2020-01-13 DIAGNOSIS — M19011 Primary osteoarthritis, right shoulder: Secondary | ICD-10-CM | POA: Diagnosis not present

## 2020-01-13 DIAGNOSIS — G8918 Other acute postprocedural pain: Secondary | ICD-10-CM | POA: Diagnosis not present

## 2020-01-13 DIAGNOSIS — M75121 Complete rotator cuff tear or rupture of right shoulder, not specified as traumatic: Secondary | ICD-10-CM | POA: Diagnosis not present

## 2020-01-13 DIAGNOSIS — M94211 Chondromalacia, right shoulder: Secondary | ICD-10-CM | POA: Diagnosis not present

## 2020-01-13 DIAGNOSIS — M24111 Other articular cartilage disorders, right shoulder: Secondary | ICD-10-CM | POA: Diagnosis not present

## 2020-01-13 DIAGNOSIS — S46011A Strain of muscle(s) and tendon(s) of the rotator cuff of right shoulder, initial encounter: Secondary | ICD-10-CM | POA: Diagnosis not present

## 2020-01-13 DIAGNOSIS — M7521 Bicipital tendinitis, right shoulder: Secondary | ICD-10-CM | POA: Diagnosis not present

## 2020-01-13 DIAGNOSIS — Y999 Unspecified external cause status: Secondary | ICD-10-CM | POA: Diagnosis not present

## 2020-01-13 DIAGNOSIS — M7541 Impingement syndrome of right shoulder: Secondary | ICD-10-CM | POA: Diagnosis not present

## 2020-01-13 DIAGNOSIS — S43431A Superior glenoid labrum lesion of right shoulder, initial encounter: Secondary | ICD-10-CM | POA: Diagnosis not present

## 2020-01-13 DIAGNOSIS — X58XXXA Exposure to other specified factors, initial encounter: Secondary | ICD-10-CM | POA: Diagnosis not present

## 2020-01-14 DIAGNOSIS — Z4889 Encounter for other specified surgical aftercare: Secondary | ICD-10-CM | POA: Diagnosis not present

## 2020-01-14 DIAGNOSIS — I890 Lymphedema, not elsewhere classified: Secondary | ICD-10-CM | POA: Diagnosis not present

## 2020-01-14 DIAGNOSIS — M25511 Pain in right shoulder: Secondary | ICD-10-CM | POA: Diagnosis not present

## 2020-01-14 DIAGNOSIS — M7541 Impingement syndrome of right shoulder: Secondary | ICD-10-CM | POA: Diagnosis not present

## 2020-01-23 DIAGNOSIS — M25511 Pain in right shoulder: Secondary | ICD-10-CM | POA: Diagnosis not present

## 2020-01-24 IMAGING — XA Imaging study
1 series · 1 of 1 positions shown · non-contrast
Comparison: none

CLINICAL DATA: Spinal stenosis of the lumbar region with
radiculopathy. Displacement of the L4-5 lumbar disc.
Anterolisthesis.

[Series 1: ortho standard · 1 of 1 slices shown]
[im 1/1]
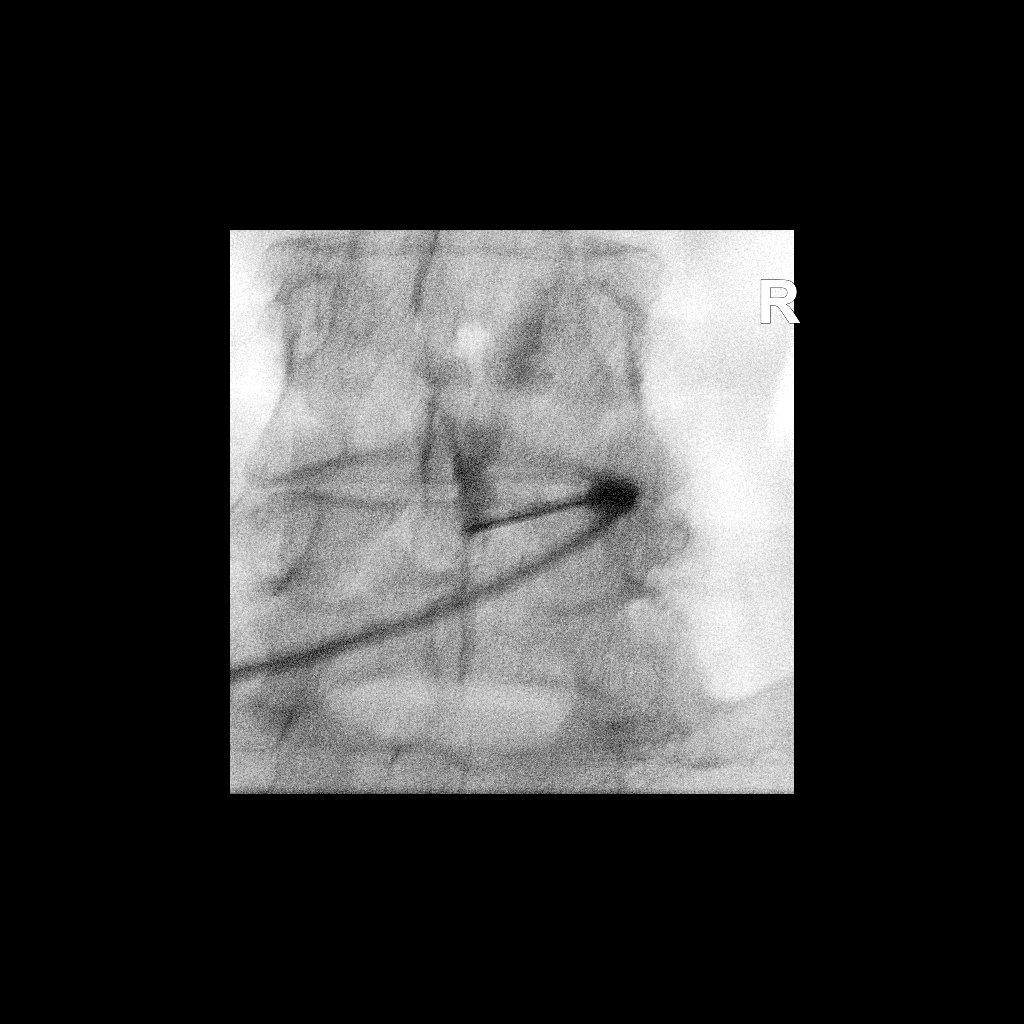

[1 of 1 positions shown; findings below may reference images not displayed]

FLUOROSCOPY TIME:  Radiation Exposure Index (as provided by the
fluoroscopic device): 2.32 uGy*m2

If the device does not provide the exposure index:

Fluoroscopy Time:  8 seconds

Number of Acquired Images:  0

PROCEDURE:
The procedure, risks, benefits, and alternatives were explained to
the patient. Questions regarding the procedure were encouraged and
answered. The patient understands and consents to the procedure.

LUMBAR EPIDURAL INJECTION:

An interlaminar approach was performed on right at L4-5. The
overlying skin was cleansed and anesthetized. A 20 gauge epidural
needle was advanced using loss-of-resistance technique.

DIAGNOSTIC EPIDURAL INJECTION:

Injection of Isovue-M 200 shows a good epidural pattern with spread
above and below the level of needle placement, primarily on the
right no vascular opacification is seen.

THERAPEUTIC EPIDURAL INJECTION:

120 mg of Depo-Medrol mixed with 3 mL 1% lidocaine were instilled.
The procedure was well-tolerated, and the patient was discharged
thirty minutes following the injection in good condition.

COMPLICATIONS:
None
IMPRESSION: Technically successful epidural injection on the right L4-5 # 1

## 2020-01-29 DIAGNOSIS — M25511 Pain in right shoulder: Secondary | ICD-10-CM | POA: Diagnosis not present

## 2020-02-05 DIAGNOSIS — M25511 Pain in right shoulder: Secondary | ICD-10-CM | POA: Diagnosis not present

## 2020-02-09 DIAGNOSIS — E785 Hyperlipidemia, unspecified: Secondary | ICD-10-CM | POA: Diagnosis not present

## 2020-02-09 DIAGNOSIS — F3341 Major depressive disorder, recurrent, in partial remission: Secondary | ICD-10-CM | POA: Diagnosis not present

## 2020-02-09 DIAGNOSIS — I10 Essential (primary) hypertension: Secondary | ICD-10-CM | POA: Diagnosis not present

## 2020-02-12 DIAGNOSIS — M25511 Pain in right shoulder: Secondary | ICD-10-CM | POA: Diagnosis not present

## 2020-02-19 DIAGNOSIS — M25511 Pain in right shoulder: Secondary | ICD-10-CM | POA: Diagnosis not present

## 2020-02-24 DIAGNOSIS — M25511 Pain in right shoulder: Secondary | ICD-10-CM | POA: Diagnosis not present

## 2020-03-03 NOTE — Progress Notes (Signed)
Please place orders in epic pt has preop tomorrow.

## 2020-03-03 NOTE — Patient Instructions (Addendum)
DUE TO COVID-19 ONLY ONE VISITOR IS ALLOWED TO COME WITH YOU AND STAY IN THE WAITING ROOM ONLY DURING PRE OP AND PROCEDURE DAY OF SURGERY. THE 1 VISITOR  MAY VISIT WITH YOU AFTER SURGERY IN YOUR PRIVATE ROOM DURING VISITING HOURS ONLY!  YOU NEED TO HAVE A COVID 19 TEST ON__2-25-22_____ @_______ , THIS TEST MUST BE DONE BEFORE SURGERY,  COVID TESTING SITE [address] [place] [address], IT IS ON THE RIGHT GOING OUT [address] APPROXIMATELY  2 MINUTES PAST [employer] ON THE RIGHT. ONCE YOUR COVID TEST IS COMPLETED,  PLEASE BEGIN THE QUARANTINE INSTRUCTIONS AS OUTLINED IN YOUR HANDOUT.                Melody [name]  03/03/2020   Your procedure is scheduled on: 03-09-20   Report to [facility] Main  Entrance   Report to admitting at         1:30 PM     Call this number if you have problems the morning of surgery [phone]    Remember: Do not eat food  :After Midnight. You may have clear liquids until 1:00 pm     CLEAR LIQUID DIET  Until 1:00 pm then  nothing by mouth   Foods Allowed                                                                       Black Coffee and tea, regular and decaf                             Plain Jell-O any favor except red or purple                                            Fruit ices (not with fruit pulp)                                     Iced Popsicles                                    Carbonated beverages, regular and diet                                    Cranberry, grape and apple juices Sports drinks like Gatorade Lightly seasoned clear broth or consume(fat free) Sugar, honey syrup   _____________________________________________________________________    BRUSH YOUR TEETH MORNING OF SURGERY AND RINSE YOUR MOUTH OUT, NO CHEWING GUM CANDY OR MINTS.     Take these medicines the morning of surgery with A SIP OF WATER: Valtrex, cymbalta, Abilify, Acetylcysteine                                 You may not  have any metal on your body including  hair pins and              piercings  Do not wear jewelry, make-up, lotions, powders or perfumes, deodorant             Do not wear nail polish on your fingernails.  Do not shave  48 hours prior to surgery.           Do not bring valuables to the hospital. [place].  Contacts, dentures or bridgework may not be worn into surgery.      Patients discharged the day of surgery will not be allowed to drive home. IF YOU ARE HAVING SURGERY AND GOING HOME THE SAME DAY, YOU MUST HAVE AN ADULT TO DRIVE YOU HOME AND BE WITH YOU FOR 24 HOURS. YOU MAY GO HOME BY TAXI OR UBER OR ORTHERWISE, BUT AN ADULT MUST ACCOMPANY YOU HOME AND STAY WITH YOU FOR 24 HOURS.  Name and phone number of your driver:  Special Instructions: N/A              Please read over the following fact sheets you were given: _____________________________________________________________________             [facility] - Preparing for Surgery Before surgery, you can play an important role.  Because skin is not sterile, your skin needs to be as free of germs as possible.  You can reduce the number of germs on your skin by washing with CHG (chlorahexidine gluconate) soap before surgery.  CHG is an antiseptic cleaner which kills germs and bonds with the skin to continue killing germs even after washing. Please DO NOT use if you have an allergy to CHG or antibacterial soaps.  If your skin becomes reddened/irritated stop using the CHG and inform your nurse when you arrive at Short Stay. Do not shave (including legs and underarms) for at least 48 hours prior to the first CHG shower.  You may shave your face/neck. Please follow these instructions carefully:  1.  Shower with CHG Soap the night before surgery and the  morning of Surgery.  2.  If you choose to wash your hair, wash your hair first as usual with your  normal  shampoo.  3.  After you shampoo,  rinse your hair and body thoroughly to remove the  shampoo.                           4.  Use CHG as you would any other liquid soap.  You can apply chg directly  to the skin and wash                       Gently with a scrungie or clean washcloth.  5.  Apply the CHG Soap to your body ONLY FROM THE NECK DOWN.   Do not use on face/ open                           Wound or open sores. Avoid contact with eyes, ears mouth and genitals (private parts).                       Wash face,  Genitals (private parts) with your normal soap.  6.  Wash thoroughly, paying special attention to the area where your surgery  will be performed.  7.  Thoroughly rinse your body with warm water from the neck down.  8.  DO NOT shower/wash with your normal soap after using and rinsing off  the CHG Soap.                9.  Pat yourself dry with a clean towel.            10.  Wear clean pajamas.            11.  Place clean sheets on your bed the night of your first shower and do not  sleep with pets. Day of Surgery : Do not apply any lotions/deodorants the morning of surgery.  Please wear clean clothes to the hospital/surgery center.  FAILURE TO FOLLOW THESE INSTRUCTIONS MAY RESULT IN THE CANCELLATION OF YOUR SURGERY PATIENT SIGNATURE_________________________________  NURSE SIGNATURE__________________________________  ________________________________________________________________________   Adam Phenix  An incentive spirometer is a tool that can help keep your lungs clear and active. This tool measures how well you are filling your lungs with each breath. Taking long deep breaths may help reverse or decrease the chance of developing breathing (pulmonary) problems (especially infection) following:  A long period of time when you are unable to move or be active. BEFORE THE PROCEDURE   If the spirometer includes an indicator to show your best effort, your nurse or respiratory therapist will set it  to a desired goal.  If possible, sit up straight or lean slightly forward. Try not to slouch.  Hold the incentive spirometer in an upright position. INSTRUCTIONS FOR USE  1. Sit on the edge of your bed if possible, or sit up as far as you can in bed or on a chair. 2. Hold the incentive spirometer in an upright position. 3. Breathe out normally. 4. Place the mouthpiece in your mouth and seal your lips tightly around it. 5. Breathe in slowly and as deeply as possible, raising the piston or the ball toward the top of the column. 6. Hold your breath for 3-5 seconds or for as long as possible. Allow the piston or ball to fall to the bottom of the column. 7. Remove the mouthpiece from your mouth and breathe out normally. 8. Rest for a few seconds and repeat Steps 1 through 7 at least 10 times every 1-2 hours when you are awake. Take your time and take a few normal breaths between deep breaths. 9. The spirometer may include an indicator to show your best effort. Use the indicator as a goal to work toward during each repetition. 10. After each set of 10 deep breaths, practice coughing to be sure your lungs are clear. If you have an incision (the cut made at the time of surgery), support your incision when coughing by placing a pillow or rolled up towels firmly against it. Once you are able to get out of bed, walk around indoors and cough well. You may stop using the incentive spirometer when instructed by your caregiver.  RISKS AND COMPLICATIONS  Take your time so you do not get dizzy or light-headed.  If you are in pain, you may need to take or ask for pain medication before doing incentive spirometry. It is harder to take a deep breath if you are having pain. AFTER USE  Rest and breathe slowly and easily.  It can be helpful to keep track of a log of your  progress. Your caregiver can provide you with a simple table to help with this. If you are using the spirometer at home, follow these  instructions: [place] IF:   You are having difficultly using the spirometer.  You have trouble using the spirometer as often as instructed.  Your pain medication is not giving enough relief while using the spirometer.  You develop fever of 100.5 F (38.1 C) or higher. SEEK IMMEDIATE MEDICAL CARE IF:   You cough up bloody sputum that had not been present before.  You develop fever of 102 F (38.9 C) or greater.  You develop worsening pain at or near the incision site. MAKE SURE YOU:   Understand these instructions.  Will watch your condition.  Will get help right away if you are not doing well or get worse. Document Released: 05/08/2006 Document Revised: 03/20/2011 Document Reviewed: 07/09/2006 [facility] Patient Information 2014 [place], [place].   ________________________________________________________________________

## 2020-03-03 NOTE — Progress Notes (Addendum)
PCP - Harlan Stains , MD Cardiologist - no  PPM/ICD -  Device Orders -  Rep Notified -   Chest x-ray -  EKG-10-09-19 on chart Stress Test -  ECHO -  Cardiac Cath -   Sleep Study -  CPAP -   Fasting Blood Sugar -  Checks Blood Sugar _____ times a day  Blood Thinner Instructions: Aspirin Instructions:  ERAS Protcol - PRE-SURGERY Ensure or G2-   COVID TEST- 03-05-20  Fully vaccinated and booster Pfizer  Activity---Able to walk a flight of stairs without SOB Anesthesia review: HTN  Patient denies shortness of breath, fever, cough and chest pain at PAT appointment   none   All instructions explained to the patient, with a verbal understanding of the material. Patient agrees to go over the instructions while at home for a better understanding. Patient also instructed to self quarantine after being tested for COVID-19. The opportunity to ask questions was provided.

## 2020-03-04 ENCOUNTER — Encounter (HOSPITAL_COMMUNITY)
Admission: RE | Admit: 2020-03-04 | Discharge: 2020-03-04 | Disposition: A | Discharge status: Home / Self Care | Payer: PPO | Source: Ambulatory Visit | Attending: Orthopedic Surgery | Admitting: Orthopedic Surgery

## 2020-03-04 ENCOUNTER — Encounter (HOSPITAL_COMMUNITY): Payer: Self-pay

## 2020-03-04 ENCOUNTER — Other Ambulatory Visit: Payer: Self-pay

## 2020-03-04 DIAGNOSIS — F3341 Major depressive disorder, recurrent, in partial remission: Secondary | ICD-10-CM | POA: Diagnosis not present

## 2020-03-04 DIAGNOSIS — Z01818 Encounter for other preprocedural examination: Secondary | ICD-10-CM | POA: Insufficient documentation

## 2020-03-04 DIAGNOSIS — E785 Hyperlipidemia, unspecified: Secondary | ICD-10-CM | POA: Diagnosis not present

## 2020-03-04 DIAGNOSIS — I10 Essential (primary) hypertension: Secondary | ICD-10-CM | POA: Diagnosis not present

## 2020-03-04 HISTORY — DX: Anxiety disorder, unspecified: F41.9

## 2020-03-04 LAB — BASIC METABOLIC PANEL
Anion gap: 8 (ref 5–15)
BUN: 25 mg/dL — ABNORMAL HIGH (ref 8–23)
CO2: 29 mmol/L (ref 22–32)
Calcium: 9.9 mg/dL (ref 8.9–10.3)
Chloride: 101 mmol/L (ref 98–111)
Creatinine, Ser: 0.68 mg/dL (ref 0.44–1.00)
GFR, Estimated: >60 mL/min (ref >60)
Glucose, Bld: 104 mg/dL — ABNORMAL HIGH (ref 70–99)
Potassium: 4.1 mmol/L (ref 3.5–5.1)
Sodium: 138 mmol/L (ref 135–145)

## 2020-03-04 LAB — CBC
HCT: 43.9 % (ref 36.0–46.0)
Hemoglobin: 14.2 g/dL (ref 12.0–15.0)
MCH: 30.5 pg (ref 26.0–34.0)
MCHC: 32.3 g/dL (ref 30.0–36.0)
MCV: 94.2 fL (ref 80.0–100.0)
Platelets: 199 10*3/uL (ref 150–400)
RBC: 4.66 MIL/uL (ref 3.87–5.11)
RDW: 13.7 % (ref 11.5–15.5)
WBC: 7.8 10*3/uL (ref 4.0–10.5)
nRBC: 0 % (ref 0.0–0.2)

## 2020-03-04 LAB — SURGICAL PCR SCREEN
MRSA, PCR: NEGATIVE
Staphylococcus aureus: NEGATIVE

## 2020-03-05 ENCOUNTER — Other Ambulatory Visit (HOSPITAL_COMMUNITY)
Admission: RE | Admit: 2020-03-05 | Discharge: 2020-03-05 | Disposition: A | Discharge status: Home / Self Care | Payer: PPO | Source: Ambulatory Visit | Attending: Orthopedic Surgery | Admitting: Orthopedic Surgery

## 2020-03-05 DIAGNOSIS — Z01812 Encounter for preprocedural laboratory examination: Secondary | ICD-10-CM | POA: Insufficient documentation

## 2020-03-05 DIAGNOSIS — Z20822 Contact with and (suspected) exposure to covid-19: Secondary | ICD-10-CM | POA: Insufficient documentation

## 2020-03-05 LAB — SARS CORONAVIRUS 2 (TAT 6-24 HRS): SARS Coronavirus 2: NEGATIVE

## 2020-03-09 ENCOUNTER — Ambulatory Visit (HOSPITAL_COMMUNITY): Payer: PPO | Admitting: Anesthesiology

## 2020-03-09 ENCOUNTER — Encounter (HOSPITAL_COMMUNITY)
Admission: RE | Disposition: A | Discharge status: Home / Self Care | Payer: Self-pay | Source: Ambulatory Visit | Attending: Orthopedic Surgery

## 2020-03-09 ENCOUNTER — Encounter (HOSPITAL_COMMUNITY): Payer: Self-pay | Admitting: Orthopedic Surgery

## 2020-03-09 ENCOUNTER — Ambulatory Visit (HOSPITAL_COMMUNITY)
Admission: RE | Admit: 2020-03-09 | Discharge: 2020-03-09 | Disposition: A | Discharge status: Home / Self Care | Payer: PPO | Source: Ambulatory Visit | Attending: Orthopedic Surgery | Admitting: Orthopedic Surgery

## 2020-03-09 DIAGNOSIS — Z96611 Presence of right artificial shoulder joint: Secondary | ICD-10-CM

## 2020-03-09 DIAGNOSIS — M19011 Primary osteoarthritis, right shoulder: Secondary | ICD-10-CM | POA: Insufficient documentation

## 2020-03-09 DIAGNOSIS — M12811 Other specific arthropathies, not elsewhere classified, right shoulder: Secondary | ICD-10-CM | POA: Diagnosis not present

## 2020-03-09 DIAGNOSIS — Z79899 Other long term (current) drug therapy: Secondary | ICD-10-CM | POA: Insufficient documentation

## 2020-03-09 DIAGNOSIS — S46011A Strain of muscle(s) and tendon(s) of the rotator cuff of right shoulder, initial encounter: Secondary | ICD-10-CM | POA: Insufficient documentation

## 2020-03-09 DIAGNOSIS — G8918 Other acute postprocedural pain: Secondary | ICD-10-CM | POA: Diagnosis not present

## 2020-03-09 DIAGNOSIS — X58XXXA Exposure to other specified factors, initial encounter: Secondary | ICD-10-CM | POA: Insufficient documentation

## 2020-03-09 DIAGNOSIS — M75121 Complete rotator cuff tear or rupture of right shoulder, not specified as traumatic: Secondary | ICD-10-CM | POA: Diagnosis not present

## 2020-03-09 DIAGNOSIS — Z7982 Long term (current) use of aspirin: Secondary | ICD-10-CM | POA: Insufficient documentation

## 2020-03-09 DIAGNOSIS — I10 Essential (primary) hypertension: Secondary | ICD-10-CM | POA: Diagnosis not present

## 2020-03-09 DIAGNOSIS — M75101 Unspecified rotator cuff tear or rupture of right shoulder, not specified as traumatic: Secondary | ICD-10-CM | POA: Diagnosis not present

## 2020-03-09 HISTORY — PX: REVERSE SHOULDER ARTHROPLASTY: SHX5054

## 2020-03-09 SURGERY — ARTHROPLASTY, SHOULDER, TOTAL, REVERSE
Anesthesia: General | Site: Shoulder | Laterality: Right

## 2020-03-09 MED ORDER — PROPOFOL 10 MG/ML IV BOLUS
INTRAVENOUS | Status: DC | PRN
  Administered 2020-03-09: 100 mg via INTRAVENOUS

## 2020-03-09 MED ORDER — LACTATED RINGERS IV SOLN: INTRAVENOUS | Status: DC

## 2020-03-09 MED ORDER — NAPROXEN 500 MG PO TABS: 500.0000 mg | ORAL_TABLET | Freq: Two times a day (BID) | ORAL | 1 refills | Status: DC

## 2020-03-09 MED ORDER — LIDOCAINE 2% (20 MG/ML) 5 ML SYRINGE
INTRAMUSCULAR | Status: DC | PRN
  Administered 2020-03-09: 50 mg via INTRAVENOUS

## 2020-03-09 MED ORDER — ORAL CARE MOUTH RINSE: 15.0000 mL | Freq: Once | OROMUCOSAL | Status: AC

## 2020-03-09 MED ORDER — CYCLOBENZAPRINE HCL 10 MG PO TABS: 10.0000 mg | ORAL_TABLET | Freq: Three times a day (TID) | ORAL | 1 refills | Status: DC | PRN

## 2020-03-09 MED ORDER — SUGAMMADEX SODIUM 200 MG/2ML IV SOLN
INTRAVENOUS | Status: DC | PRN
  Administered 2020-03-09: 200 mg via INTRAVENOUS

## 2020-03-09 MED ORDER — 0.9 % SODIUM CHLORIDE (POUR BTL) OPTIME
TOPICAL | Status: DC | PRN
  Administered 2020-03-09: 1000 mL

## 2020-03-09 MED ORDER — OXYCODONE-ACETAMINOPHEN 5-325 MG PO TABS: 1.0000 | ORAL_TABLET | ORAL | 0 refills | Status: DC | PRN

## 2020-03-09 MED ORDER — VANCOMYCIN HCL 1000 MG IV SOLR
INTRAVENOUS | Status: AC
  Filled 2020-03-09: qty 1000

## 2020-03-09 MED ORDER — ROCURONIUM BROMIDE 10 MG/ML (PF) SYRINGE
PREFILLED_SYRINGE | INTRAVENOUS | Status: AC
  Filled 2020-03-09: qty 10

## 2020-03-09 MED ORDER — VANCOMYCIN HCL 1000 MG IV SOLR
INTRAVENOUS | Status: DC | PRN
  Administered 2020-03-09: 1000 mg via TOPICAL

## 2020-03-09 MED ORDER — CEFAZOLIN SODIUM-DEXTROSE 2-4 GM/100ML-% IV SOLN
2.0000 g | INTRAVENOUS | Status: AC
  Administered 2020-03-09: 2 g via INTRAVENOUS
  Filled 2020-03-09: qty 100

## 2020-03-09 MED ORDER — OXYCODONE HCL 5 MG PO TABS: 5.0000 mg | ORAL_TABLET | Freq: Once | ORAL | Status: DC | PRN

## 2020-03-09 MED ORDER — BUPIVACAINE HCL (PF) 0.5 % IJ SOLN
INTRAMUSCULAR | Status: DC | PRN
  Administered 2020-03-09: 15 mL via PERINEURAL

## 2020-03-09 MED ORDER — BUPIVACAINE LIPOSOME 1.3 % IJ SUSP
INTRAMUSCULAR | Status: DC | PRN
  Administered 2020-03-09: 10 mL via PERINEURAL

## 2020-03-09 MED ORDER — DEXAMETHASONE SODIUM PHOSPHATE 10 MG/ML IJ SOLN
INTRAMUSCULAR | Status: AC
  Filled 2020-03-09: qty 1

## 2020-03-09 MED ORDER — MIDAZOLAM HCL 2 MG/2ML IJ SOLN
INTRAMUSCULAR | Status: AC
  Filled 2020-03-09: qty 2

## 2020-03-09 MED ORDER — PROPOFOL 10 MG/ML IV BOLUS
INTRAVENOUS | Status: AC
  Filled 2020-03-09: qty 20

## 2020-03-09 MED ORDER — PHENYLEPHRINE HCL-NACL 10-0.9 MG/250ML-% IV SOLN
INTRAVENOUS | Status: DC | PRN
  Administered 2020-03-09: 25 ug/min via INTRAVENOUS

## 2020-03-09 MED ORDER — MIDAZOLAM HCL 5 MG/5ML IJ SOLN
INTRAMUSCULAR | Status: DC | PRN
  Administered 2020-03-09: .5 mg via INTRAVENOUS

## 2020-03-09 MED ORDER — TRANEXAMIC ACID-NACL 1000-0.7 MG/100ML-% IV SOLN
1000.0000 mg | INTRAVENOUS | Status: AC
  Administered 2020-03-09: 1000 mg via INTRAVENOUS
  Filled 2020-03-09: qty 100

## 2020-03-09 MED ORDER — STERILE WATER FOR IRRIGATION IR SOLN
Status: DC | PRN
  Administered 2020-03-09: 2000 mL

## 2020-03-09 MED ORDER — PHENYLEPHRINE HCL (PRESSORS) 10 MG/ML IV SOLN
INTRAVENOUS | Status: AC
  Filled 2020-03-09: qty 1

## 2020-03-09 MED ORDER — CHLORHEXIDINE GLUCONATE 0.12 % MT SOLN
15.0000 mL | Freq: Once | OROMUCOSAL | Status: AC
  Administered 2020-03-09: 15 mL via OROMUCOSAL

## 2020-03-09 MED ORDER — LACTATED RINGERS IV BOLUS
500.0000 mL | Freq: Once | INTRAVENOUS | Status: AC
  Administered 2020-03-09: 500 mL via INTRAVENOUS

## 2020-03-09 MED ORDER — ONDANSETRON HCL 4 MG/2ML IJ SOLN
INTRAMUSCULAR | Status: DC | PRN
  Administered 2020-03-09: 4 mg via INTRAVENOUS

## 2020-03-09 MED ORDER — DEXAMETHASONE SODIUM PHOSPHATE 10 MG/ML IJ SOLN
INTRAMUSCULAR | Status: DC | PRN
  Administered 2020-03-09: 6 mg via INTRAVENOUS

## 2020-03-09 MED ORDER — ONDANSETRON HCL 4 MG PO TABS: 4.0000 mg | ORAL_TABLET | Freq: Three times a day (TID) | ORAL | 0 refills | Status: DC | PRN

## 2020-03-09 MED ORDER — FENTANYL CITRATE (PF) 100 MCG/2ML IJ SOLN: 25.0000 ug | INTRAMUSCULAR | Status: DC | PRN

## 2020-03-09 MED ORDER — FENTANYL CITRATE (PF) 250 MCG/5ML IJ SOLN
INTRAMUSCULAR | Status: AC
  Filled 2020-03-09: qty 5

## 2020-03-09 MED ORDER — OXYCODONE HCL 5 MG/5ML PO SOLN
5.0000 mg | Freq: Once | ORAL | Status: DC | PRN
Start: 2020-03-09 — End: 2020-03-10

## 2020-03-09 MED ORDER — FENTANYL CITRATE (PF) 100 MCG/2ML IJ SOLN
INTRAMUSCULAR | Status: DC | PRN
  Administered 2020-03-09 (×2): 50 ug via INTRAVENOUS

## 2020-03-09 MED ORDER — MIDAZOLAM HCL 2 MG/2ML IJ SOLN
1.0000 mg | INTRAMUSCULAR | Status: AC
  Administered 2020-03-09: 0.5 mg via INTRAVENOUS
  Filled 2020-03-09: qty 2

## 2020-03-09 MED ORDER — ROCURONIUM BROMIDE 10 MG/ML (PF) SYRINGE
PREFILLED_SYRINGE | INTRAVENOUS | Status: DC | PRN
  Administered 2020-03-09: 60 mg via INTRAVENOUS

## 2020-03-09 MED ORDER — LIDOCAINE HCL (PF) 2 % IJ SOLN
INTRAMUSCULAR | Status: AC
  Filled 2020-03-09: qty 5

## 2020-03-09 MED ORDER — ONDANSETRON HCL 4 MG/2ML IJ SOLN
INTRAMUSCULAR | Status: AC
  Filled 2020-03-09: qty 2

## 2020-03-09 MED ORDER — ONDANSETRON HCL 4 MG/2ML IJ SOLN: 4.0000 mg | Freq: Once | INTRAMUSCULAR | Status: DC | PRN

## 2020-03-09 MED ORDER — FENTANYL CITRATE (PF) 100 MCG/2ML IJ SOLN
50.0000 ug | INTRAMUSCULAR | Status: AC
  Administered 2020-03-09: 75 ug via INTRAVENOUS
  Filled 2020-03-09: qty 2

## 2020-03-09 MED ORDER — ALBUMIN HUMAN 5 % IV SOLN: INTRAVENOUS | Status: DC | PRN

## 2020-03-09 SURGICAL SUPPLY — 66 items
BAG ZIPLOCK 12X15 (MISCELLANEOUS) ×2 IMPLANT
BLADE SAW SGTL 83.5X18.5 (BLADE) ×2 IMPLANT
COOLER ICEMAN CLASSIC (MISCELLANEOUS) IMPLANT
COVER BACK TABLE 60X90IN (DRAPES) ×2 IMPLANT
COVER SURGICAL LIGHT HANDLE (MISCELLANEOUS) ×2 IMPLANT
COVER WAND RF STERILE (DRAPES) IMPLANT
CUP SUT UNIV REVERS 36 NEUTRAL (Cup) ×2 IMPLANT
DERMABOND ADVANCED (GAUZE/BANDAGES/DRESSINGS) ×1
DERMABOND ADVANCED .7 DNX12 (GAUZE/BANDAGES/DRESSINGS) ×1 IMPLANT
DRAPE INCISE IOBAN 66X45 STRL (DRAPES) IMPLANT
DRAPE ORTHO SPLIT 77X108 STRL (DRAPES) ×2
DRAPE SHEET LG 3/4 BI-LAMINATE (DRAPES) ×2 IMPLANT
DRAPE SURG 17X11 SM STRL (DRAPES) ×2 IMPLANT
DRAPE SURG ORHT 6 SPLT 77X108 (DRAPES) ×2 IMPLANT
DRAPE U-SHAPE 47X51 STRL (DRAPES) ×2 IMPLANT
DRSG AQUACEL AG ADV 3.5X10 (GAUZE/BANDAGES/DRESSINGS) ×2 IMPLANT
DURAPREP 26ML APPLICATOR (WOUND CARE) ×2 IMPLANT
ELECT BLADE TIP CTD 4 INCH (ELECTRODE) ×2 IMPLANT
ELECT REM PT RETURN 15FT ADLT (MISCELLANEOUS) ×2 IMPLANT
FACESHIELD WRAPAROUND (MASK) ×8 IMPLANT
GLENOID UNI REV MOD 24 +2 LAT (Joint) ×2 IMPLANT
GLENOSPHERE 36 +4 LAT/24 (Joint) ×2 IMPLANT
GLOVE SS BIOGEL STRL SZ 7 (GLOVE) ×1 IMPLANT
GLOVE SS BIOGEL STRL SZ 7.5 (GLOVE) ×1 IMPLANT
GLOVE SUPERSENSE BIOGEL SZ 7 (GLOVE) ×1
GLOVE SUPERSENSE BIOGEL SZ 7.5 (GLOVE) ×1
GLOVE SURG ENC MOIS LTX SZ7.5 (GLOVE) ×2 IMPLANT
GLOVE SURG ENC MOIS LTX SZ8 (GLOVE) ×2 IMPLANT
GLOVE SURG SYN 7.0 (GLOVE) IMPLANT
GLOVE SURG SYN 7.5  E (GLOVE)
GLOVE SURG SYN 7.5 E (GLOVE) IMPLANT
GLOVE SURG SYN 8.0 (GLOVE) IMPLANT
GOWN STRL REUS W/TWL LRG LVL3 (GOWN DISPOSABLE) ×4 IMPLANT
KIT BASIN OR (CUSTOM PROCEDURE TRAY) ×2 IMPLANT
KIT TURNOVER KIT A (KITS) ×2 IMPLANT
LINER HUMERAL 36 +3MM SM (Shoulder) ×2 IMPLANT
MANIFOLD NEPTUNE II (INSTRUMENTS) ×2 IMPLANT
NEEDLE TAPERED W/ NITINOL LOOP (MISCELLANEOUS) ×2 IMPLANT
NS IRRIG 1000ML POUR BTL (IV SOLUTION) ×2 IMPLANT
PACK SHOULDER (CUSTOM PROCEDURE TRAY) ×2 IMPLANT
PAD ARMBOARD 7.5X6 YLW CONV (MISCELLANEOUS) ×2 IMPLANT
PAD COLD SHLDR WRAP-ON (PAD) IMPLANT
PIN NITINOL TARGETER 2.8 (PIN) IMPLANT
PIN SET MODULAR GLENOID SYSTEM (PIN) IMPLANT
RESTRAINT HEAD UNIVERSAL NS (MISCELLANEOUS) ×2 IMPLANT
SCREW CENTRAL MOD 30MM (Screw) ×2 IMPLANT
SCREW PERI LOCK 5.5X16 (Screw) ×2 IMPLANT
SCREW PERI LOCK 5.5X32 (Screw) ×2 IMPLANT
SCREW PERIPHERAL 5.5X20 LOCK (Screw) ×2 IMPLANT
SCREW PERIPHERAL 5.5X28 LOCK (Screw) ×2 IMPLANT
SLING ARM FOAM STRAP LRG (SOFTGOODS) IMPLANT
SLING ARM FOAM STRAP MED (SOFTGOODS) IMPLANT
SPONGE LAP 18X18 RF (DISPOSABLE) IMPLANT
STEM HUMERAL UNI REVERS SZ6 (Stem) ×2 IMPLANT
SUCTION FRAZIER HANDLE 12FR (TUBING) ×2
SUCTION TUBE FRAZIER 12FR DISP (TUBING) ×1 IMPLANT
SUT FIBERWIRE #2 38 T-5 BLUE (SUTURE)
SUT MNCRL AB 3-0 PS2 18 (SUTURE) ×2 IMPLANT
SUT MON AB 2-0 CT1 36 (SUTURE) ×2 IMPLANT
SUT VIC AB 1 CT1 36 (SUTURE) ×2 IMPLANT
SUTURE FIBERWR #2 38 T-5 BLUE (SUTURE) IMPLANT
SUTURE TAPE 1.3 40 TPR END (SUTURE) ×2 IMPLANT
SUTURETAPE 1.3 40 TPR END (SUTURE) ×4
TOWEL OR 17X26 10 PK STRL BLUE (TOWEL DISPOSABLE) ×2 IMPLANT
TOWEL OR NON WOVEN STRL DISP B (DISPOSABLE) ×2 IMPLANT
WATER STERILE IRR 1000ML POUR (IV SOLUTION) ×4 IMPLANT

## 2020-03-09 NOTE — Anesthesia Preprocedure Evaluation (Signed)
Anesthesia Evaluation  Patient identified by MRN, date of birth, ID band Patient awake    Reviewed: Allergy & Precautions, H&P , NPO status , Patient's Chart, lab work & pertinent test results  Airway Mallampati: II  TM Distance: >3 FB Neck ROM: Full    Dental no notable dental hx.    Pulmonary neg pulmonary ROS,    Pulmonary exam normal breath sounds clear to auscultation       Cardiovascular hypertension, Pt. on medications Normal cardiovascular exam Rhythm:Regular Rate:Normal     Neuro/Psych Anxiety negative neurological ROS     GI/Hepatic negative GI ROS, Neg liver ROS,   Endo/Other  negative endocrine ROS  Renal/GU negative Renal ROS  negative genitourinary   Musculoskeletal negative musculoskeletal ROS (+)   Abdominal   Peds negative pediatric ROS (+)  Hematology negative hematology ROS (+)   Anesthesia Other Findings   Reproductive/Obstetrics negative OB ROS                             Anesthesia Physical Anesthesia Plan  ASA: II  Anesthesia Plan: General   Post-op Pain Management:  Regional for Post-op pain   Induction: Intravenous  PONV Risk Score and Plan: 3 and Ondansetron, Dexamethasone, Midazolam and Treatment may vary due to age or medical condition  Airway Management Planned: Oral ETT  Additional Equipment:   Intra-op Plan:   Post-operative Plan: Extubation in OR  Informed Consent: I have reviewed the patients History and Physical, chart, labs and discussed the procedure including the risks, benefits and alternatives for the proposed anesthesia with the patient or authorized representative who has indicated his/her understanding and acceptance.     Dental advisory given  Plan Discussed with: CRNA and Surgeon  Anesthesia Plan Comments:         Anesthesia Quick Evaluation

## 2020-03-09 NOTE — Discharge Instructions (Signed)
General Anesthesia, Adult, Care After This sheet gives you information about how to care for yourself after your procedure. Your health care provider may also give you more specific instructions. If you have problems or questions, contact your health care provider. What can I expect after the procedure? After the procedure, the following side effects are common:  Pain or discomfort at the IV site.  Nausea.  Vomiting.  Sore throat.  Trouble concentrating.  Feeling cold or chills.  Feeling weak or tired.  Sleepiness and fatigue.  Soreness and body aches. These side effects can affect parts of the body that were not involved in surgery. Follow these instructions at home: For the time period you were told by your health care provider:  Rest.  Do not participate in activities where you could fall or become injured.  Do not drive or use machinery.  Do not drink alcohol.  Do not take sleeping pills or medicines that cause drowsiness.  Do not make important decisions or sign legal documents.  Do not take care of children on your own.   Eating and drinking  Follow any instructions from your health care provider about eating or drinking restrictions.  When you feel hungry, start by eating small amounts of foods that are soft and easy to digest (bland), such as toast. Gradually return to your regular diet.  Drink enough fluid to keep your urine pale yellow.  If you vomit, rehydrate by drinking water, juice, or clear broth. General instructions  If you have sleep apnea, surgery and certain medicines can increase your risk for breathing problems. Follow instructions from your health care provider about wearing your sleep device: ? Anytime you are sleeping, including during daytime naps. ? While taking prescription pain medicines, sleeping medicines, or medicines that make you drowsy.  Have a responsible adult stay with you for the time you are told. It is important to have  someone help care for you until you are awake and alert.  Return to your normal activities as told by your health care provider. Ask your health care provider what activities are safe for you.  Take over-the-counter and prescription medicines only as told by your health care provider.  If you smoke, do not smoke without supervision.  Keep all follow-up visits as told by your health care provider. This is important. Contact a health care provider if:  You have nausea or vomiting that does not get better with medicine.  You cannot eat or drink without vomiting.  You have pain that does not get better with medicine.  You are unable to pass urine.  You develop a skin rash.  You have a fever.  You have redness around your IV site that gets worse. Get help right away if:  You have difficulty breathing.  You have chest pain.  You have blood in your urine or stool, or you vomit blood. Summary  After the procedure, it is common to have a sore throat or nausea. It is also common to feel tired.  Have a responsible adult stay with you for the time you are told. It is important to have someone help care for you until you are awake and alert.  When you feel hungry, start by eating small amounts of foods that are soft and easy to digest (bland), such as toast. Gradually return to your regular diet.  Drink enough fluid to keep your urine pale yellow.  Return to your normal activities as told by your health care provider.   Ask your health care provider what activities are safe for you. This information is not intended to replace advice given to you by your health care provider. Make sure you discuss any questions you have with your health care provider. Document Revised: 09/11/2019 Document Reviewed: 04/10/2019 Elsevier Patient Education  2021 Loganton Supple, M.D., F.A.A.O.S. Orthopaedic Surgery Specializing in Arthroscopic and Reconstructive Surgery of the  Shoulder [phone] [address] [place],  [address] - Fax [phone]   POST-OP TOTAL SHOULDER REPLACEMENT INSTRUCTIONS  1. Follow up in the office for your first post-op appointment 10-14 days from the date of your surgery. If you do not already have a scheduled appointment, our office will contact you to schedule.  2. The bandage over your incision is waterproof. You may begin showering with this dressing on. You may leave this dressing on until first follow up appointment within 2 weeks. We prefer you leave this dressing in place until follow up however after 5-7 days if you are having itching or skin irritation and would like to remove it you may do so. Go slow and tug at the borders gently to break the bond the dressing has with the skin. At this point if there is no drainage it is okay to go without a bandage or you may cover it with a light guaze and tape. You can also expect significant bruising around your shoulder that will drift down your arm and into your chest wall. This is very normal and should resolve over several days.   3. Wear your sling/immobilizer at all times except to perform the exercises below or to occasionally let your arm dangle by your side to stretch your elbow. You also need to sleep in your sling immobilizer until instructed otherwise. It is ok to remove your sling if you are sitting in a controlled environment and allow your arm to rest in a position of comfort by your side or on your lap with pillows to give your neck and skin a break from the sling. You may remove it to allow arm to dangle by side to shower. If you are up walking around and when you go to sleep at night you need to wear it.  4. Range of motion to your elbow, wrist, and hand are encouraged 3-5 times daily. Exercise to your hand and fingers helps to reduce swelling you may experience.   5. Prescriptions for a pain medication and a muscle relaxant are provided for you. It is  recommended that if you are experiencing pain that you pain medication alone is not controlling, add the muscle relaxant along with the pain medication which can give additional pain relief. The first 1-2 days is generally the most severe of your pain and then should gradually decrease. As your pain lessens it is recommended that you decrease your use of the pain medications to an "as needed basis'" only and to always comply with the recommended dosages of the pain medications.  6. Pain medications can produce constipation along with their use. If you experience this, the use of an over the counter stool softener or laxative daily is recommended.   7. For additional questions or concerns, please do not hesitate to call the office. If after hours there is an answering service to forward your concerns to the physician on call.  8.Pain control following an exparel block  To help control your post-operative pain you received a nerve block  performed with  Exparel which is a long acting anesthetic (numbing agent) which can provide pain relief and sensations of numbness (and relief of pain) in the operative shoulder and arm for up to 3 days. Sometimes it provides mixed relief, meaning you may still have numbness in certain areas of the arm but can still be able to move  parts of that arm, hand, and fingers. We recommend that your prescribed pain medications  be used as needed. We do not feel it is necessary to "pre medicate" and "stay ahead" of pain.  Taking narcotic pain medications when you are not having any pain can lead to unnecessary and potentially dangerous side effects.    9. Use the ice machine as much as possible in the first 5-7 days from surgery, then you can wean its use to as needed. The ice typically needs to be replaced every 6 hours, instead of ice you can actually freeze water bottles to put in the cooler and then fill water around them to avoid having to purchase ice. You can have spare water  bottles freezing to allow you to rotate them once they have melted. Try to have a thin shirt or light cloth or towel under the ice wrap to protect your skin.   FOR ADDITIONAL INFO ON ICE MACHINE AND INSTRUCTIONS GO TO THE WEBSITE AT  [email or web address]  10.  We recommend that you avoid any dental work or cleaning in the first 3 months following your joint replacement. This is to help minimize the possibility of infection from the bacteria in your mouth that enters your bloodstream during dental work. We also recommend that you take an antibiotic prior to your dental work for the first year after your shoulder replacement to further help reduce that risk. Please simply contact our office for antibiotics to be sent to your pharmacy prior to dental work.  11. Dental Antibiotics:  In most cases prophylactic antibiotics for Dental procdeures after total joint surgery are not necessary.  Exceptions are as follows:  1. History of prior total joint infection  2. Severely immunocompromised (Organ Transplant, cancer chemotherapy, Rheumatoid biologic meds such as [place])  3. Poorly controlled diabetes (A1C &gt; 8.0, blood glucose over 200)  If you have one of these conditions, contact your surgeon for an antibiotic prescription, prior to your dental procedure.   POST-OP EXERCISES  Pendulum Exercises  Perform pendulum exercises while standing and bending at the waist. Support your uninvolved arm on a table or chair and allow your operated arm to hang freely. Make sure to do these exercises passively - not using you shoulder muscles. These exercises can be performed once your nerve block effects have worn off.  Repeat 20 times. Do 3 sessions per day.

## 2020-03-09 NOTE — Anesthesia Procedure Notes (Signed)
Anesthesia Regional Block: Interscalene brachial plexus block   Pre-Anesthetic Checklist: ,, timeout performed, Correct Patient, Correct Site, Correct Laterality, Correct Procedure, Correct Position, site marked, Risks and benefits discussed,  Surgical consent,  Pre-op evaluation,  At surgeon's request and post-op pain management  Laterality: Right  Prep: chloraprep       Needles:  Injection technique: Single-shot  Needle Type: Echogenic Needle     Needle Length: 9cm      Additional Needles:   Procedures:,,,, ultrasound used (permanent image in chart),,,,  Narrative:  Start time: 03/09/2020 4:10 PM End time: 03/09/2020 4:19 PM Injection made incrementally with aspirations every 5 mL.  Performed by: Personally  Anesthesiologist: Myrtie Soman, MD  Additional Notes: Patient tolerated the procedure well without complications

## 2020-03-09 NOTE — Evaluation (Signed)
Occupational Therapy Evaluation Patient Details Name: Melody [name] MRN: [id number] DOB: October 03, 1945 Today's Date: 03/09/2020    History of Present Illness Patient is a 75 year old female hx of previous R rotator cuff repair admitted 3/1 for R reverse total shoulder arthroplasty   Clinical Impression   Patient is pre-op shoulder replacement ultimately resulting in decreased functional use of right dominant upper extremity secondary to effects of surgery and interscalene block and shoulder precautions. Therapist provided education and instruction to patient in regards to exercises, precautions, positioning, donning upper extremity clothing and bathing while maintaining shoulder precautions, ice and edema management and donning/doffing sling. Patient verbalized understanding. Patient to follow up with MD for further therapy needs.      Follow Up Recommendations  Follow surgeon's recommendation for DC plan and follow-up therapies    Equipment Recommendations  None recommended by OT       Precautions / Restrictions Precautions Precautions: Shoulder Type of Shoulder Precautions: If sitting in controlled environment, ok to come out of sling to give neck a break. Please sleep in it to protect until follow up in office.     OK to use operative arm for feeding, hygiene and ADLs. Ok to instruct Pendulums and lap slides as exercises. Ok to use operative arm within the following parameters for ADL purposes New ROM (8/18) Ok for PROM, AAROM, AROM within pain tolerance and within the following ROM ER 20 ABD 45 FE 60 Shoulder Interventions: Shoulder sling/immobilizer;Off for dressing/bathing/exercises Precaution Booklet Issued: Yes (comment) Required Braces or Orthoses: Sling Restrictions Weight Bearing Restrictions: Yes RUE Weight Bearing: Non weight bearing          ADL either performed or assessed with clinical judgement   ADL                                         General  ADL Comments: educated patient in compensatory strategies for ADLs in order to maintain shoulder precautions. pt verbalize understanding                  Pertinent Vitals/Pain Pain Assessment: Faces Faces Pain Scale: Hurts a little bit Pain Location: R shoulder Pain Descriptors / Indicators: Discomfort Pain Intervention(s): Monitored during session     Hand Dominance Right   Extremity/Trunk Assessment Upper Extremity Assessment Upper Extremity Assessment: RUE deficits/detail RUE Deficits / Details: deferred 2* pre-op           Communication Communication Communication: No difficulties   Cognition Arousal/Alertness: Awake/alert Behavior During Therapy: WFL for tasks assessed/performed Overall Cognitive Status: Within Functional Limits for tasks assessed                                        Exercises Exercises: Shoulder   Shoulder Instructions Shoulder Instructions Donning/doffing shirt without moving shoulder: Patient able to independently direct caregiver Method for sponge bathing under operated UE: Patient able to independently direct caregiver Donning/doffing sling/immobilizer: Patient able to independently direct caregiver Correct positioning of sling/immobilizer: Patient able to independently direct caregiver Pendulum exercises (written home exercise program): Patient able to independently direct caregiver ROM for elbow, wrist and digits of operated UE: Patient able to independently direct caregiver Sling wearing schedule (on at all times/off for ADL's): Patient able to independently direct caregiver Proper positioning of operated UE when showering:  Patient able to independently direct caregiver Positioning of UE while sleeping: Patient able to independently direct caregiver    Home Living Family/patient expects to be discharged to:: Private residence Living Arrangements: Children Available Help at Discharge: Family;Available 24  hours/day Type of Home: House Home Access: Stairs to enter [employer] of Steps: 8 Entrance Stairs-Rails: Left Home Layout: One level         Bathroom Toilet: Handicapped height                Prior Functioning/Environment Level of Independence: Independent                 OT Problem List: Pain;Impaired UE functional use         OT Goals(Current goals can be found in the care plan section) Acute Rehab OT Goals Patient Stated Goal: sleep better OT Goal Formulation: All assessment and education complete, DC therapy   AM-PAC OT "6 Clicks" Daily Activity     Outcome Measure Help from another person eating meals?: A Little Help from another person taking care of personal grooming?: A Little Help from another person toileting, which includes using toliet, bedpan, or urinal?: A Little Help from another person bathing (including washing, rinsing, drying)?: A Little Help from another person to put on and taking off regular upper body clothing?: A Lot Help from another person to put on and taking off regular lower body clothing?: A Little 6 Click Score: 17   End of Session Nurse Communication: Other (comment) (edu completed)  Activity Tolerance: Patient tolerated treatment well Patient left: in bed;with call bell/phone within reach  OT Visit Diagnosis: Pain Pain - Right/Left: Right Pain - part of body: Shoulder                Time: 9518-8416 OT Time Calculation (min): 24 min Charges:  OT General Charges $OT Visit: 1 Visit OT Evaluation $OT Eval Low Complexity: 1 Low OT Treatments $Self Care/Home Management : 8-22 mins  Delbert Phenix OT OT pager: Chauncey 03/09/2020, 2:34 PM

## 2020-03-09 NOTE — Anesthesia Postprocedure Evaluation (Signed)
Anesthesia Post Note  Patient: Melody [name]  Procedure(s) Performed: REVERSE SHOULDER ARTHROPLASTY (Right Shoulder)     Patient location during evaluation: PACU Anesthesia Type: General Level of consciousness: awake and alert Pain management: pain level controlled Vital Signs Assessment: post-procedure vital signs reviewed and stable Respiratory status: spontaneous breathing, nonlabored ventilation, respiratory function stable and patient connected to nasal cannula oxygen Cardiovascular status: blood pressure returned to baseline and stable Postop Assessment: no apparent nausea or vomiting Anesthetic complications: no   No complications documented.  Last Vitals:  Vitals:   03/09/20 1639 03/09/20 1903  BP: (!) 148/81   Pulse: 82 81  Resp: 18 15  Temp:    SpO2: 99% 100%    Last Pain:  Vitals:   03/09/20 1903  TempSrc:   PainSc: 0-No pain                 Verland Sprinkle S

## 2020-03-09 NOTE — Op Note (Signed)
03/09/2020  6:42 PM  PATIENT:   Melody [name]  75 y.o. female  PRE-OPERATIVE DIAGNOSIS:  Right shoulder rotator cuff tear arthropathy  POST-OPERATIVE DIAGNOSIS: Same  PROCEDURE: Right shoulder reverse arthroplasty utilizing a press-fit size 6 Arthrex stem with a +3 polyethylene insert, 36/+4 glenosphere on a small/+2 baseplate.  Additionally, previously placed suture anchors were removed from the humeral head and metaphysis.  SURGEON:  Marin Shutter M.D.  ASSISTANTS: Jenetta Loges, PA-C  ANESTHESIA:   General endotracheal and interscalene block with Exparel  EBL: 200 cc  SPECIMEN: None  Drains: None   PATIENT DISPOSITION:  PACU - hemodynamically stable.    PLAN OF CARE: Discharge to home after PACU  Brief history:  Melody [name] is a 75 year old female who had a right shoulder injury with significant change in functional status and was found to have a large retracted tear of the rotator cuff.  Given her high level of function we had proceeded with an arthroscopic rotator cuff repair back on January 4 of this year.  At her 6-week follow-up she was noted to have a prominence which had developed over the anterolateral aspect of the greater tuberosity which was tender to palpation and moved in conjunction with the humerus on rotational movements.  Subsequent MRI scan showed displaced suture anchor as well as a large retracted tear of the rotator cuff.  Additionally there been significant further atrophy and fatty infiltration of the rotator cuff musculature with narrowing of the acromial humeral interval.  Given these findings is felt that she is developing irreparable rotator cuff tear with rotator cuff tear arthropathy.  She is brought to the operating this time for planned right shoulder reverse arthroplasty.  Preoperatively, I counseled the patient regarding treatment options and risks versus benefits thereof.  Possible surgical complications were all reviewed including potential for  bleeding, infection, neurovascular injury, persistent pain, loss of motion, anesthetic complication, failure of the implant, and possible need for additional surgery. They understand and accept and agrees with our planned procedure.   Procedure in detail:  After undergoing routine preop evaluation the patient received prophylactic antibiotics and interscalene block with Exparel was established in the holding area by the anesthesia department.  Patient was subsequently placed supine on the operating table underwent the smooth induction of a general endotracheal anesthesia.  Placed into the beachchair position and appropriately padded and protected.  The right shoulder girdle region was sterilely prepped and draped in standard fashion.  Timeout was called.  A deltopectoral approach was made to the right shoulder through an approximate centimeter incision.  Skin flaps were elevated dissection carried deeply the deltopectoral interval was developed from proximal to distal with the vein taken laterally.  Adhesions were divided beneath the deltoid and this allowed [place] to identify the displaced suture anchor which was removed in its entirety with the associated suture material.  The conjoined tendon was mobilized and retracted medially and the subscapularis was then divided from its insertion into the lesser tuberosity and tagged and reflected medially and the long head biceps tendon was then tenodesed at the upper border of the pectoralis major tendon the proximal segment was excised.  The rotator cuff was completely deficient superiorly.  The capsular attachments were divided from the anterior and inferior margins of the humeral neck and the humeral head was then delivered through the wound.  We outlined our proposed humeral head resection with the extra medullary guide and this was then performed with an oscillating saw.  This allowed  access to an additional suture anchor which was removed.  A metal cap was then  placed over the cut proximal humeral surface and we then exposed the glenoid with appropriate retractors.  A circumferential labral resection was completed.  A guidepin was then directed into the center of the humeral head and the glenoid was reamed with the central and peripheral reamer to a stable subchondral bony bed.  The glenoid was prepared with the central drill and tap and a 30 mm lag screw was selected the baseplate was assembled with vancomycin powder applied to the threads and it was inserted with excellent purchase and fixation.  The peripheral locking screws were all then placed using standard technique with excellent purchase and fixation.  A 36/+4 glenosphere was then impacted onto the baseplate and the central locking screw was placed.  At this point we returned our attention to the humeral metaphysis where the canal was opened and at this point we identified the third and final suture anchor which was removed and then we broached to a size 6 and the use a central reamer to prepare the metaphysis.  A trial implant was then placed and this showed good motion good stability good soft tissue balance.  The trial was removed the final implant was assembled on the back table vancomycin powder was spread liberally into the humeral canal and the implant was then impacted with excellent purchase and fixation.  Trial reduction showed a +3 probably give [place] the best fit and soft tissue balance motion and stability.  Trial was removed the final +3 polywas impacted after the implant was cleaned and dried.  Final reduction was then completed with again motion stability and soft tissue balance much to our satisfaction.  The wounds copes irrigated.  Good elasticity of the subscapularis was confirmed and it was repaired back to the eyelets on the collar implant using the previously placed suture tape sutures.  The arm easily achieved 45 degrees of external rotation on the repair the subscap.  Final irrigation was  completed hemostasis was obtained.  The balance of the vancomycin powder was then placed into the soft tissues.  The incision was closed with a interrupted figure-of-eight #1 Vicryl 4 the deltopectoral interval 2-0 Vicryl in the subcu layer and intracuticular 3-0 Monocryl for the skin followed by Dermabond and Aquacel dressing.  The right arm was placed into a sling and the patient was awakened, extubated, and taken to the recovery room in stable condition.  Jenetta Loges, PA-C was utilized as an Environmental consultant throughout this case, essential for help with positioning the patient, positioning extremity, tissue manipulation, implantation of the prosthesis, suture management, wound closure, and intraoperative decision-making.  Marin Shutter MD   Contact # [phone]

## 2020-03-09 NOTE — H&P (Signed)
Melody [name]    Chief Complaint: Right shoulder rotator cuff tear arthropathy HPI: The patient is a 75 y.o. female who is status post an index right shoulder arthroscopy with rotator cuff repair on 01/13/2020.  She initially did quite well but at her 6-week follow-up visit she was noted to have a painful prominence in relation to the lateral aspect of the greater tuberosity with concerns for displaced suture anchor.  A subsequent MRI scan confirmed a large retracted recurrent rotator cuff tear with a displaced suture anchor.  On further review she did have evidence for significant atrophy and fatty infiltration of the superior rotator cuff musculature.  It is felt that in essence she has progressed to an irreparable rotator cuff tear with rotator cuff tear arthropathy and she is now brought to the operating room for planned reverse shoulder arthroplasty.  Past Medical History:  Diagnosis Date  . Anxiety   . Depression   . Hypertension     Past Surgical History:  Procedure Laterality Date  . BREAST RECONSTRUCTION  1980   breast implant placement  . BREAST RECONSTRUCTION  1994   breast implant removal  . CESAREAN SECTION     x 2  . CHOLECYSTECTOMY    . COLONOSCOPY    . rotaror cuff repair     01-13-20  . TONSILLECTOMY     and adneoids 1955    Family History  Problem Relation Age of Onset  . Heart disease Father     Social History:  reports that she has never smoked. She has never used smokeless tobacco. She reports that she does not drink alcohol and does not use drugs.   Medications Prior to Admission  Medication Sig Dispense Refill  . Acetylcysteine 600 MG CAPS Take 600 mg by mouth daily.    . ARIPiprazole (ABILIFY) 10 MG tablet Take 0.5 tablets (5 mg total) by mouth daily. 90 tablet 0  . aspirin 81 MG tablet Take 81 mg by mouth daily.    Marland Kitchen atorvastatin (LIPITOR) 20 MG tablet Take 20 mg by mouth at bedtime.    Marland Kitchen b complex vitamins tablet Take 1 tablet by mouth daily.    .  benazepril-hydrochlorthiazide (LOTENSIN HCT) 10-12.5 MG tablet Take 1 tablet by mouth daily.    . Calcium Carb-Cholecalciferol (CALCIUM-VITAMIN D3) 600-400 MG-UNIT TABS Take 1 tablet by mouth daily.    . clonazePAM (KLONOPIN) 0.5 MG tablet Take 1 tablet (0.5 mg total) by mouth at bedtime. 90 tablet 1  . DULoxetine (CYMBALTA) 30 MG capsule Take 3 capsules (90 mg total) by mouth daily. 270 capsule 1  . Multiple Vitamins-Minerals (MULTIVITAMIN PO) Take 1 tablet by mouth at bedtime.    . Omega-3 Fatty Acids (FISH OIL) 1000 MG CAPS Take 1,000 mg by mouth daily.    . traZODone (DESYREL) 100 MG tablet Take 2 tablets (200 mg total) by mouth at bedtime. (Patient taking differently: Take 100 mg by mouth at bedtime.) 180 tablet 1  . Turmeric 500 MG CAPS Take 500 mg by mouth daily.    . valACYclovir (VALTREX) 500 MG tablet Take 500 mg by mouth daily.       Physical Exam: Right shoulder demonstrates moderately guarded motion but her previous arthroscopy portals are well-healed.  She does have a prominence that moves in relation to the proximal humerus localized over the lateral aspect of the greater tuberosity.  This is quite tender with palpation overlying soft tissues and does have the clinical appearance of a displaced suture  anchor.  A subsequent MRI scan was obtained confirming a displaced suture anchor as well as a large retracted atrophied and fatty infiltrated rotator cuff tear.  Vitals  Temp:  [97.9 F (36.6 C)] 97.9 F (36.6 C) (03/01 1334) Pulse Rate:  [85] 85 (03/01 1334) Resp:  [20] 20 (03/01 1334) BP: (155)/(86) 155/86 (03/01 1334) SpO2:  [97 %] 97 % (03/01 1334) Weight:  [54.9 kg] 54.9 kg (03/01 1354)  Assessment/Plan  Impression: Right shoulder rotator cuff tear arthropathy  Plan of Action: Procedure(s): REVERSE SHOULDER ARTHROPLASTY  Melody [name] 03/09/2020, 4:21 PM Contact # [phone]

## 2020-03-09 NOTE — Anesthesia Procedure Notes (Signed)
Procedure Name: Intubation Date/Time: 03/09/2020 5:27 PM Performed by: Lissa Morales, CRNA Pre-anesthesia Checklist: Patient identified, Emergency Drugs available, Suction available and Patient being monitored Patient Re-evaluated:Patient Re-evaluated prior to induction Oxygen Delivery Method: Circle system utilized Preoxygenation: Pre-oxygenation with 100% oxygen Induction Type: IV induction Ventilation: Mask ventilation without difficulty Laryngoscope Size: Glidescope and 3 Grade View: Grade III Tube type: Oral Tube size: 7.0 mm Number of attempts: 1 Airway Equipment and Method: Stylet and Oral airway Placement Confirmation: ETT inserted through vocal cords under direct vision,  positive ETCO2 and breath sounds checked- equal and bilateral Secured at: 21 cm Tube secured with: Tape Dental Injury: Teeth and Oropharynx as per pre-operative assessment

## 2020-03-09 NOTE — Progress Notes (Signed)
Assisted Dr. George Rose  with Right Interscalene Brachial Plexus block. Side rails up, monitors on throughout procedure. See vital signs in flow sheet. Tolerated Procedure well.  

## 2020-03-09 NOTE — Transfer of Care (Signed)
Immediate Anesthesia Transfer of Care Note  Patient: Melody [name]  Procedure(s) Performed: REVERSE SHOULDER ARTHROPLASTY (Right Shoulder)  Patient Location: PACU  Anesthesia Type:GA combined with regional for post-op pain  Level of Consciousness: awake, alert  and oriented  Airway & Oxygen Therapy: Patient Spontanous Breathing and Patient connected to face mask oxygen  Post-op Assessment: Report given to RN and Post -op Vital signs reviewed and stable  Post vital signs: Reviewed and stable  Last Vitals:  Vitals Value Taken Time  BP 180/95 03/09/20 1903  Temp    Pulse 84 03/09/20 1906  Resp 15 03/09/20 1906  SpO2 100 % 03/09/20 1906  Vitals shown include unvalidated device data.  Last Pain:  Vitals:   03/09/20 1903  TempSrc:   PainSc: 0-No pain      Patients Stated Pain Goal: 3 (74/60/02 9847)  Complications: No complications documented.

## 2020-03-10 ENCOUNTER — Encounter (HOSPITAL_COMMUNITY): Payer: Self-pay | Admitting: Orthopedic Surgery

## 2020-03-22 DIAGNOSIS — Z96611 Presence of right artificial shoulder joint: Secondary | ICD-10-CM | POA: Diagnosis not present

## 2020-03-22 DIAGNOSIS — Z471 Aftercare following joint replacement surgery: Secondary | ICD-10-CM | POA: Diagnosis not present

## 2020-04-05 DIAGNOSIS — M25511 Pain in right shoulder: Secondary | ICD-10-CM | POA: Diagnosis not present

## 2020-04-13 DIAGNOSIS — M25511 Pain in right shoulder: Secondary | ICD-10-CM | POA: Diagnosis not present

## 2020-04-16 DIAGNOSIS — Z1231 Encounter for screening mammogram for malignant neoplasm of breast: Secondary | ICD-10-CM | POA: Diagnosis not present

## 2020-04-16 DIAGNOSIS — M25511 Pain in right shoulder: Secondary | ICD-10-CM | POA: Diagnosis not present

## 2020-04-19 DIAGNOSIS — Z471 Aftercare following joint replacement surgery: Secondary | ICD-10-CM | POA: Diagnosis not present

## 2020-04-19 DIAGNOSIS — Z96611 Presence of right artificial shoulder joint: Secondary | ICD-10-CM | POA: Diagnosis not present

## 2020-04-20 DIAGNOSIS — M25511 Pain in right shoulder: Secondary | ICD-10-CM | POA: Diagnosis not present

## 2020-04-22 DIAGNOSIS — M25511 Pain in right shoulder: Secondary | ICD-10-CM | POA: Diagnosis not present

## 2020-04-26 DIAGNOSIS — M25511 Pain in right shoulder: Secondary | ICD-10-CM | POA: Diagnosis not present

## 2020-04-29 DIAGNOSIS — M25511 Pain in right shoulder: Secondary | ICD-10-CM | POA: Diagnosis not present

## 2020-05-03 ENCOUNTER — Other Ambulatory Visit: Payer: Self-pay | Admitting: Psychiatry

## 2020-05-03 DIAGNOSIS — F3342 Major depressive disorder, recurrent, in full remission: Secondary | ICD-10-CM

## 2020-05-03 DIAGNOSIS — I10 Essential (primary) hypertension: Secondary | ICD-10-CM | POA: Diagnosis not present

## 2020-05-03 DIAGNOSIS — F3341 Major depressive disorder, recurrent, in partial remission: Secondary | ICD-10-CM | POA: Diagnosis not present

## 2020-05-03 DIAGNOSIS — E785 Hyperlipidemia, unspecified: Secondary | ICD-10-CM | POA: Diagnosis not present

## 2020-05-03 DIAGNOSIS — M25511 Pain in right shoulder: Secondary | ICD-10-CM | POA: Diagnosis not present

## 2020-05-06 DIAGNOSIS — M25511 Pain in right shoulder: Secondary | ICD-10-CM | POA: Diagnosis not present

## 2020-05-07 ENCOUNTER — Other Ambulatory Visit: Payer: Self-pay | Admitting: Psychiatry

## 2020-05-07 DIAGNOSIS — F5105 Insomnia due to other mental disorder: Secondary | ICD-10-CM

## 2020-05-12 DIAGNOSIS — Z0000 Encounter for general adult medical examination without abnormal findings: Secondary | ICD-10-CM | POA: Diagnosis not present

## 2020-05-12 DIAGNOSIS — A609 Anogenital herpesviral infection, unspecified: Secondary | ICD-10-CM | POA: Diagnosis not present

## 2020-05-12 DIAGNOSIS — B351 Tinea unguium: Secondary | ICD-10-CM | POA: Diagnosis not present

## 2020-05-12 DIAGNOSIS — Z79899 Other long term (current) drug therapy: Secondary | ICD-10-CM | POA: Diagnosis not present

## 2020-05-12 DIAGNOSIS — E785 Hyperlipidemia, unspecified: Secondary | ICD-10-CM | POA: Diagnosis not present

## 2020-05-12 DIAGNOSIS — E559 Vitamin D deficiency, unspecified: Secondary | ICD-10-CM | POA: Diagnosis not present

## 2020-05-12 DIAGNOSIS — I10 Essential (primary) hypertension: Secondary | ICD-10-CM | POA: Diagnosis not present

## 2020-05-12 DIAGNOSIS — M8588 Other specified disorders of bone density and structure, other site: Secondary | ICD-10-CM | POA: Diagnosis not present

## 2020-05-13 DIAGNOSIS — M25511 Pain in right shoulder: Secondary | ICD-10-CM | POA: Diagnosis not present

## 2020-05-17 DIAGNOSIS — M8589 Other specified disorders of bone density and structure, multiple sites: Secondary | ICD-10-CM | POA: Diagnosis not present

## 2020-05-18 DIAGNOSIS — M25511 Pain in right shoulder: Secondary | ICD-10-CM | POA: Diagnosis not present

## 2020-05-21 DIAGNOSIS — M25511 Pain in right shoulder: Secondary | ICD-10-CM | POA: Diagnosis not present

## 2020-05-25 DIAGNOSIS — M25511 Pain in right shoulder: Secondary | ICD-10-CM | POA: Diagnosis not present

## 2020-05-27 DIAGNOSIS — M25511 Pain in right shoulder: Secondary | ICD-10-CM | POA: Diagnosis not present

## 2020-05-31 DIAGNOSIS — Z96611 Presence of right artificial shoulder joint: Secondary | ICD-10-CM | POA: Diagnosis not present

## 2020-05-31 DIAGNOSIS — Z471 Aftercare following joint replacement surgery: Secondary | ICD-10-CM | POA: Diagnosis not present

## 2020-06-02 DIAGNOSIS — M25511 Pain in right shoulder: Secondary | ICD-10-CM | POA: Diagnosis not present

## 2020-06-16 ENCOUNTER — Ambulatory Visit: Payer: PPO | Admitting: Psychiatry

## 2020-06-23 DIAGNOSIS — B351 Tinea unguium: Secondary | ICD-10-CM | POA: Diagnosis not present

## 2020-06-23 DIAGNOSIS — Z79899 Other long term (current) drug therapy: Secondary | ICD-10-CM | POA: Diagnosis not present

## 2020-06-24 ENCOUNTER — Encounter: Payer: Self-pay | Admitting: Psychiatry

## 2020-06-24 ENCOUNTER — Ambulatory Visit (INDEPENDENT_AMBULATORY_CARE_PROVIDER_SITE_OTHER): Payer: PPO | Admitting: Psychiatry

## 2020-06-24 ENCOUNTER — Other Ambulatory Visit: Payer: Self-pay

## 2020-06-24 DIAGNOSIS — F5105 Insomnia due to other mental disorder: Secondary | ICD-10-CM | POA: Diagnosis not present

## 2020-06-24 DIAGNOSIS — R5383 Other fatigue: Secondary | ICD-10-CM

## 2020-06-24 DIAGNOSIS — F331 Major depressive disorder, recurrent, moderate: Secondary | ICD-10-CM | POA: Diagnosis not present

## 2020-06-24 MED ORDER — BREXPIPRAZOLE 1 MG PO TABS: 1.0000 mg | ORAL_TABLET | Freq: Every day | ORAL | 0 refills | Status: DC
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
## 2020-06-24 NOTE — Patient Instructions (Signed)
ed with the addition of

## 2020-06-29 DIAGNOSIS — F3341 Major depressive disorder, recurrent, in partial remission: Secondary | ICD-10-CM | POA: Diagnosis not present

## 2020-06-29 DIAGNOSIS — I10 Essential (primary) hypertension: Secondary | ICD-10-CM | POA: Diagnosis not present

## 2020-06-29 DIAGNOSIS — E785 Hyperlipidemia, unspecified: Secondary | ICD-10-CM | POA: Diagnosis not present

## 2020-07-20 ENCOUNTER — Telehealth: Payer: Self-pay | Admitting: Psychiatry

## 2020-07-20 ENCOUNTER — Other Ambulatory Visit: Payer: Self-pay | Admitting: Psychiatry

## 2020-07-20 DIAGNOSIS — F331 Major depressive disorder, recurrent, moderate: Secondary | ICD-10-CM

## 2020-07-20 MED ORDER — BREXPIPRAZOLE 1 MG PO TABS: 1.0000 mg | ORAL_TABLET | Freq: Every day | ORAL | 1 refills | Status: AC

## 2020-07-20 NOTE — Telephone Encounter (Signed)
Patient lm stating this is the 2nd week on the [place] and it is working well. Send in a Rx per patiens request.

## 2020-07-20 NOTE — Telephone Encounter (Signed)
Please review

## 2020-07-20 NOTE — Telephone Encounter (Signed)
sent 

## 2020-07-21 NOTE — Telephone Encounter (Signed)
Pt called back and said that the rexulti will cost her over $ 400 and she can't afford that. She said she haas enough pills until Sunday and then she will stop it. Please send in something different that is similar.

## 2020-07-22 NOTE — Telephone Encounter (Signed)
I will contact pharmacy to verify cost.   Another option if too expensive?

## 2020-07-22 NOTE — Telephone Encounter (Signed)
I was unable to speak to anyone at [facility] yet. Do you want her to continue with samples for now or ?s

## 2020-07-24 NOTE — Telephone Encounter (Signed)
She failed cheaper alternative Abilify.  Give her samples Rexulti 2 mg tablets and tell her to take 1 every other day until I return

## 2020-07-25 ENCOUNTER — Telehealth: Payer: Self-pay | Admitting: Psychiatry

## 2020-07-25 DIAGNOSIS — F3342 Major depressive disorder, recurrent, in full remission: Secondary | ICD-10-CM

## 2020-07-26 NOTE — Telephone Encounter (Signed)
Informed pt to pick up samples

## 2020-08-03 DIAGNOSIS — I10 Essential (primary) hypertension: Secondary | ICD-10-CM | POA: Diagnosis not present

## 2020-08-03 DIAGNOSIS — F3341 Major depressive disorder, recurrent, in partial remission: Secondary | ICD-10-CM | POA: Diagnosis not present

## 2020-08-03 DIAGNOSIS — E785 Hyperlipidemia, unspecified: Secondary | ICD-10-CM | POA: Diagnosis not present

## 2020-08-16 NOTE — Telephone Encounter (Signed)
Pt called in for an update on help with medication. States that Rexulti ifs $430 and she can't afford that. She says CC was looking into getting her help with affording the [facility]. Pls RTC [phone].

## 2020-08-16 NOTE — Telephone Encounter (Signed)
Melody [name] she is retired with limited income.  Could you please call the rep and see if there is a low income program that the company might have that would help her.  Otherwise will have to change meds.

## 2020-08-16 NOTE — Telephone Encounter (Signed)
Spoke with [facility] and her co-pay would be $430.07 for 30 day supply. She has Medicare and they are paying $834.93. pt unable to use discount card with Medicare.

## 2020-08-23 ENCOUNTER — Telehealth: Payer: Self-pay | Admitting: Psychiatry

## 2020-08-23 NOTE — Telephone Encounter (Signed)
Next visit is 09/14/20. Melody [name] called stating that she has taken her last samples of Rexulti 1/2 mg tablets. She has been waiting to hear back on a prior authorization for her Rexulti as it cost her over $430.00 for 30 days. She did check [facility] and the total for 30 days is at least $1200.00. She cannot afford this price as she is on a limited income. Could someone please call her at [phone]. She would like to pick up some more samples if possible.

## 2020-08-23 NOTE — Telephone Encounter (Signed)
Message sent to [facility] on 08/08 about getting some assistance from Dr. Clovis Pu.  I will follow up with her in the morning if there are any recommendations. If not medication change.

## 2020-08-23 NOTE — Telephone Encounter (Signed)
Please review

## 2020-08-24 NOTE — Telephone Encounter (Signed)
Give her 1 mg samples Rexulti and tell her toake it every other day to make it last while we try to see if we can get assistance for her costs.

## 2020-08-25 NOTE — Telephone Encounter (Signed)
Pt came by for samples, I pulled 0.5 mg tablets and 1 mg tablets. She was given instructions to take 1 mg every other day, she verified instructions. She reports she was given 2 mg samples previously and took every other day as well.  After insurance her cost is still over $400.00 per month.

## 2020-08-25 NOTE — Telephone Encounter (Signed)
Pt informed

## 2020-08-26 DIAGNOSIS — F3341 Major depressive disorder, recurrent, in partial remission: Secondary | ICD-10-CM | POA: Diagnosis not present

## 2020-08-26 DIAGNOSIS — I10 Essential (primary) hypertension: Secondary | ICD-10-CM | POA: Diagnosis not present

## 2020-08-26 DIAGNOSIS — E785 Hyperlipidemia, unspecified: Secondary | ICD-10-CM | POA: Diagnosis not present

## 2020-09-10 DIAGNOSIS — H2513 Age-related nuclear cataract, bilateral: Secondary | ICD-10-CM | POA: Diagnosis not present

## 2020-09-10 DIAGNOSIS — H04123 Dry eye syndrome of bilateral lacrimal glands: Secondary | ICD-10-CM | POA: Diagnosis not present

## 2020-09-10 DIAGNOSIS — H524 Presbyopia: Secondary | ICD-10-CM | POA: Diagnosis not present

## 2020-09-14 ENCOUNTER — Other Ambulatory Visit: Payer: Self-pay

## 2020-09-14 ENCOUNTER — Encounter: Payer: Self-pay | Admitting: Psychiatry

## 2020-09-14 ENCOUNTER — Ambulatory Visit: Payer: PPO | Admitting: Psychiatry

## 2020-09-14 DIAGNOSIS — F5105 Insomnia due to other mental disorder: Secondary | ICD-10-CM

## 2020-09-14 DIAGNOSIS — F331 Major depressive disorder, recurrent, moderate: Secondary | ICD-10-CM
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
## 2020-09-16 NOTE — Telephone Encounter (Signed)
Noted  

## 2020-09-20 DIAGNOSIS — Z96611 Presence of right artificial shoulder joint: Secondary | ICD-10-CM | POA: Diagnosis not present

## 2020-09-23 DIAGNOSIS — Z23 Encounter for immunization: Secondary | ICD-10-CM | POA: Diagnosis not present

## 2020-09-26 DIAGNOSIS — R0981 Nasal congestion: Secondary | ICD-10-CM | POA: Diagnosis not present

## 2020-09-26 DIAGNOSIS — R051 Acute cough: Secondary | ICD-10-CM | POA: Diagnosis not present

## 2020-09-26 DIAGNOSIS — B9689 Other specified bacterial agents as the cause of diseases classified elsewhere: Secondary | ICD-10-CM | POA: Diagnosis not present

## 2020-09-26 DIAGNOSIS — J208 Acute bronchitis due to other specified organisms: Secondary | ICD-10-CM | POA: Diagnosis not present

## 2020-09-27 ENCOUNTER — Other Ambulatory Visit: Payer: Self-pay

## 2020-09-27 DIAGNOSIS — F3342 Major depressive disorder, recurrent, in full remission: Secondary | ICD-10-CM

## 2020-09-27 DIAGNOSIS — F5105 Insomnia due to other mental disorder: Secondary | ICD-10-CM

## 2020-09-28 MED ORDER — MULTIVITAMIN PO TABS: 1.0000 | ORAL_TABLET | Freq: Every day | ORAL | 3 refills | Status: AC

## 2020-09-28 MED ORDER — TRAZODONE HCL 100 MG PO TABS: ORAL_TABLET | ORAL | 3 refills | Status: DC

## 2020-09-28 MED ORDER — DULOXETINE HCL 30 MG PO CPEP: ORAL_CAPSULE | ORAL | 3 refills | Status: DC

## 2020-10-23 ENCOUNTER — Other Ambulatory Visit: Payer: Self-pay | Admitting: Psychiatry

## 2020-10-23 DIAGNOSIS — F5105 Insomnia due to other mental disorder: Secondary | ICD-10-CM

## 2020-10-27 DIAGNOSIS — E785 Hyperlipidemia, unspecified: Secondary | ICD-10-CM | POA: Diagnosis not present

## 2020-10-27 DIAGNOSIS — F3341 Major depressive disorder, recurrent, in partial remission: Secondary | ICD-10-CM | POA: Diagnosis not present

## 2020-10-27 DIAGNOSIS — I10 Essential (primary) hypertension: Secondary | ICD-10-CM | POA: Diagnosis not present

## 2020-11-12 DIAGNOSIS — E785 Hyperlipidemia, unspecified: Secondary | ICD-10-CM | POA: Diagnosis not present

## 2020-11-12 DIAGNOSIS — E559 Vitamin D deficiency, unspecified: Secondary | ICD-10-CM | POA: Diagnosis not present

## 2020-11-12 DIAGNOSIS — Q670 Congenital facial asymmetry: Secondary | ICD-10-CM | POA: Diagnosis not present

## 2020-11-12 DIAGNOSIS — I10 Essential (primary) hypertension: Secondary | ICD-10-CM | POA: Diagnosis not present

## 2020-11-12 DIAGNOSIS — R413 Other amnesia: Secondary | ICD-10-CM | POA: Diagnosis not present

## 2020-11-15 DIAGNOSIS — Z23 Encounter for immunization: Secondary | ICD-10-CM | POA: Diagnosis not present

## 2020-11-15 DIAGNOSIS — Z86018 Personal history of other benign neoplasm: Secondary | ICD-10-CM | POA: Diagnosis not present

## 2020-11-15 DIAGNOSIS — D225 Melanocytic nevi of trunk: Secondary | ICD-10-CM | POA: Diagnosis not present

## 2020-11-15 DIAGNOSIS — L814 Other melanin hyperpigmentation: Secondary | ICD-10-CM | POA: Diagnosis not present

## 2020-11-15 DIAGNOSIS — D2272 Melanocytic nevi of left lower limb, including hip: Secondary | ICD-10-CM | POA: Diagnosis not present

## 2020-11-15 DIAGNOSIS — D2271 Melanocytic nevi of right lower limb, including hip: Secondary | ICD-10-CM | POA: Diagnosis not present

## 2020-11-15 DIAGNOSIS — L578 Other skin changes due to chronic exposure to nonionizing radiation: Secondary | ICD-10-CM | POA: Diagnosis not present

## 2020-11-15 DIAGNOSIS — L821 Other seborrheic keratosis: Secondary | ICD-10-CM | POA: Diagnosis not present

## 2020-11-29 DIAGNOSIS — E785 Hyperlipidemia, unspecified: Secondary | ICD-10-CM | POA: Diagnosis not present

## 2020-11-29 DIAGNOSIS — F3341 Major depressive disorder, recurrent, in partial remission: Secondary | ICD-10-CM | POA: Diagnosis not present

## 2020-11-29 DIAGNOSIS — I10 Essential (primary) hypertension: Secondary | ICD-10-CM | POA: Diagnosis not present

## 2020-12-14 ENCOUNTER — Ambulatory Visit (INDEPENDENT_AMBULATORY_CARE_PROVIDER_SITE_OTHER): Payer: PPO | Admitting: Psychiatry

## 2020-12-14 ENCOUNTER — Encounter: Payer: Self-pay | Admitting: Psychiatry

## 2020-12-14 ENCOUNTER — Other Ambulatory Visit: Payer: Self-pay

## 2020-12-14 DIAGNOSIS — F5105 Insomnia due to other mental disorder: Secondary | ICD-10-CM

## 2020-12-14 DIAGNOSIS — F331 Major depressive disorder, recurrent, moderate: Secondary | ICD-10-CM

## 2020-12-14 MED ORDER — CLONAZEPAM 0.5 MG PO TABS: 0.5000 mg | ORAL_TABLET | Freq: Every day | ORAL | 1 refills | Status: DC

## 2020-12-14 MED ORDER — TRAZODONE HCL 100 MG PO TABS: ORAL_TABLET | ORAL | 3 refills | Status: DC
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
## 2021-01-22 ENCOUNTER — Other Ambulatory Visit: Payer: Self-pay | Admitting: Psychiatry

## 2021-01-22 DIAGNOSIS — F3342 Major depressive disorder, recurrent, in full remission: Secondary | ICD-10-CM

## 2021-01-25 ENCOUNTER — Other Ambulatory Visit: Payer: Self-pay | Admitting: Psychiatry

## 2021-01-25 DIAGNOSIS — F3342 Major depressive disorder, recurrent, in full remission: Secondary | ICD-10-CM

## 2021-01-27 DIAGNOSIS — F3341 Major depressive disorder, recurrent, in partial remission: Secondary | ICD-10-CM | POA: Diagnosis not present

## 2021-01-27 DIAGNOSIS — E785 Hyperlipidemia, unspecified: Secondary | ICD-10-CM | POA: Diagnosis not present

## 2021-01-27 DIAGNOSIS — I10 Essential (primary) hypertension: Secondary | ICD-10-CM | POA: Diagnosis not present

## 2021-03-02 ENCOUNTER — Telehealth: Payer: Self-pay

## 2021-03-02 NOTE — Telephone Encounter (Signed)
Received a fax from [facility] that the currently only have CALCIUM 500 MG in stock. Order received was for CALCIUM 600 MG & Vitamin D 400 IU. They are asking to clarify if you are to continue the pt on therapy.

## 2021-03-14 DIAGNOSIS — Z96611 Presence of right artificial shoulder joint: Secondary | ICD-10-CM | POA: Diagnosis not present

## 2021-04-25 DIAGNOSIS — Z1231 Encounter for screening mammogram for malignant neoplasm of breast: Secondary | ICD-10-CM | POA: Diagnosis not present

## 2021-05-19 DIAGNOSIS — I10 Essential (primary) hypertension: Secondary | ICD-10-CM | POA: Diagnosis not present

## 2021-05-19 DIAGNOSIS — A609 Anogenital herpesviral infection, unspecified: Secondary | ICD-10-CM | POA: Diagnosis not present

## 2021-05-19 DIAGNOSIS — R151 Fecal smearing: Secondary | ICD-10-CM | POA: Diagnosis not present

## 2021-05-19 DIAGNOSIS — Z0000 Encounter for general adult medical examination without abnormal findings: Secondary | ICD-10-CM | POA: Diagnosis not present

## 2021-05-19 DIAGNOSIS — E559 Vitamin D deficiency, unspecified: Secondary | ICD-10-CM | POA: Diagnosis not present

## 2021-05-19 DIAGNOSIS — E785 Hyperlipidemia, unspecified: Secondary | ICD-10-CM | POA: Diagnosis not present

## 2021-05-19 DIAGNOSIS — F3341 Major depressive disorder, recurrent, in partial remission: Secondary | ICD-10-CM | POA: Diagnosis not present

## 2021-06-14 ENCOUNTER — Ambulatory Visit: Payer: PPO | Admitting: Psychiatry

## 2021-06-14 ENCOUNTER — Encounter: Payer: Self-pay | Admitting: Psychiatry

## 2021-06-14 DIAGNOSIS — F5105 Insomnia due to other mental disorder: Secondary | ICD-10-CM

## 2021-06-14 DIAGNOSIS — F3342 Major depressive disorder, recurrent, in full remission: Secondary | ICD-10-CM

## 2021-06-14 MED ORDER — CLONAZEPAM 0.5 MG PO TABS: 0.5000 mg | ORAL_TABLET | Freq: Every day | ORAL | 1 refills | Status: DC

## 2021-06-14 MED ORDER — DULOXETINE HCL 30 MG PO CPEP: ORAL_CAPSULE | ORAL | 1 refills | Status: DC
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
## 2021-06-20 ENCOUNTER — Telehealth: Payer: Self-pay | Admitting: Psychiatry

## 2021-06-20 ENCOUNTER — Other Ambulatory Visit: Payer: Self-pay

## 2021-06-20 DIAGNOSIS — F3342 Major depressive disorder, recurrent, in full remission: Secondary | ICD-10-CM

## 2021-06-20 MED ORDER — DULOXETINE HCL 30 MG PO CPEP: ORAL_CAPSULE | ORAL | 1 refills | Status: DC

## 2021-06-20 NOTE — Telephone Encounter (Signed)
Rx sent 

## 2021-06-20 NOTE — Telephone Encounter (Signed)
Pt called at 11:30 am and said that the dulox[facility] needs to be [facility] and sent to [facility] special[facility] pharmacy because she get this medicine through patient assistance

## 2021-08-02 DIAGNOSIS — M9901 Segmental and somatic dysfunction of cervical region: Secondary | ICD-10-CM | POA: Diagnosis not present

## 2021-08-02 DIAGNOSIS — M5412 Radiculopathy, cervical region: Secondary | ICD-10-CM | POA: Diagnosis not present

## 2021-08-02 DIAGNOSIS — M542 Cervicalgia: Secondary | ICD-10-CM | POA: Diagnosis not present

## 2021-08-02 DIAGNOSIS — M25512 Pain in left shoulder: Secondary | ICD-10-CM | POA: Diagnosis not present

## 2021-08-04 DIAGNOSIS — M5412 Radiculopathy, cervical region: Secondary | ICD-10-CM | POA: Diagnosis not present

## 2021-08-04 DIAGNOSIS — M542 Cervicalgia: Secondary | ICD-10-CM | POA: Diagnosis not present

## 2021-08-04 DIAGNOSIS — M9901 Segmental and somatic dysfunction of cervical region: Secondary | ICD-10-CM | POA: Diagnosis not present

## 2021-08-04 DIAGNOSIS — M25512 Pain in left shoulder: Secondary | ICD-10-CM | POA: Diagnosis not present

## 2021-08-09 DIAGNOSIS — M9901 Segmental and somatic dysfunction of cervical region: Secondary | ICD-10-CM | POA: Diagnosis not present

## 2021-08-09 DIAGNOSIS — M542 Cervicalgia: Secondary | ICD-10-CM | POA: Diagnosis not present

## 2021-08-09 DIAGNOSIS — M5412 Radiculopathy, cervical region: Secondary | ICD-10-CM | POA: Diagnosis not present

## 2021-08-09 DIAGNOSIS — M25512 Pain in left shoulder: Secondary | ICD-10-CM | POA: Diagnosis not present

## 2021-08-11 DIAGNOSIS — M9901 Segmental and somatic dysfunction of cervical region: Secondary | ICD-10-CM | POA: Diagnosis not present

## 2021-08-11 DIAGNOSIS — M542 Cervicalgia: Secondary | ICD-10-CM | POA: Diagnosis not present

## 2021-08-11 DIAGNOSIS — M5412 Radiculopathy, cervical region: Secondary | ICD-10-CM | POA: Diagnosis not present

## 2021-08-11 DIAGNOSIS — M25512 Pain in left shoulder: Secondary | ICD-10-CM | POA: Diagnosis not present

## 2021-08-16 DIAGNOSIS — M542 Cervicalgia: Secondary | ICD-10-CM | POA: Diagnosis not present

## 2021-08-16 DIAGNOSIS — M5412 Radiculopathy, cervical region: Secondary | ICD-10-CM | POA: Diagnosis not present

## 2021-08-16 DIAGNOSIS — M25512 Pain in left shoulder: Secondary | ICD-10-CM | POA: Diagnosis not present

## 2021-08-16 DIAGNOSIS — M9901 Segmental and somatic dysfunction of cervical region: Secondary | ICD-10-CM | POA: Diagnosis not present

## 2021-08-18 DIAGNOSIS — M25512 Pain in left shoulder: Secondary | ICD-10-CM | POA: Diagnosis not present

## 2021-08-18 DIAGNOSIS — M5412 Radiculopathy, cervical region: Secondary | ICD-10-CM | POA: Diagnosis not present

## 2021-08-18 DIAGNOSIS — M542 Cervicalgia: Secondary | ICD-10-CM | POA: Diagnosis not present

## 2021-08-18 DIAGNOSIS — M9901 Segmental and somatic dysfunction of cervical region: Secondary | ICD-10-CM | POA: Diagnosis not present

## 2021-08-25 DIAGNOSIS — M5412 Radiculopathy, cervical region: Secondary | ICD-10-CM | POA: Diagnosis not present

## 2021-08-25 DIAGNOSIS — M542 Cervicalgia: Secondary | ICD-10-CM | POA: Diagnosis not present

## 2021-08-25 DIAGNOSIS — M9901 Segmental and somatic dysfunction of cervical region: Secondary | ICD-10-CM | POA: Diagnosis not present

## 2021-08-25 DIAGNOSIS — M25512 Pain in left shoulder: Secondary | ICD-10-CM | POA: Diagnosis not present

## 2021-09-13 DIAGNOSIS — H524 Presbyopia: Secondary | ICD-10-CM | POA: Diagnosis not present

## 2021-09-13 DIAGNOSIS — H2513 Age-related nuclear cataract, bilateral: Secondary | ICD-10-CM | POA: Diagnosis not present

## 2021-09-14 ENCOUNTER — Telehealth: Payer: Self-pay | Admitting: Adult Health

## 2021-09-22 ENCOUNTER — Ambulatory Visit: Payer: PPO | Admitting: Psychiatry

## 2021-09-22 DIAGNOSIS — F411 Generalized anxiety disorder: Secondary | ICD-10-CM | POA: Diagnosis not present
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
## 2021-10-03 NOTE — Telephone Encounter (Signed)
Error

## 2021-10-06 ENCOUNTER — Ambulatory Visit: Payer: PPO | Admitting: Psychiatry

## 2021-10-06 DIAGNOSIS — F411 Generalized anxiety disorder: Secondary | ICD-10-CM

## 2021-10-06 NOTE — Progress Notes (Signed)
[facility] Counselor/Therapist Progress Note  Patient ID: Melody [name], MRN: [id number],    Date: 10/06/2021  Time Spent: 50 minutes   Treatment Type: Individual Therapy  Reported Symptoms: anxiety (some waking up but hoping her anxiety med will help)  Mental Status Exam:  Appearance:   Neat     Behavior:  Appropriate, Sharing, and Motivated  Motor:  Normal  Speech/Language:   Clear and Coherent  Affect:  Anxious, some flatness in affect but patient denies depression  Mood:  anxious  Thought process:  goal directed  Thought content:    WNL  Sensory/Perceptual disturbances:    WNL  Orientation:  oriented to person, place, time/date, situation, day of week, month of year, year, and stated date of Sept. 28, 2023  Attention:  Good  Concentration:  Fair  Memory:  Some short term memory issues and her Dr has reassured her "I'm fine and not having significant memory concerns."  Fund of knowledge:   Good  Insight:    Good and Fair  Judgment:   Good  Impulse Control:  Good and Fair   Risk Assessment: Danger to Self:  No Self-injurious Behavior: No Danger to Others: No Duty to Warn:no Physical Aggression / Violence:No  Access to Firearms a concern: No  Gang Involvement:No   Subjective: Patient in today reporting anxiety as main symptom and seems to bother her more in awakening during the night and dwelling on things in the past. Not feeling as positive about herself and activities, encouraged her to make a list as she was able to name several possible activities that may lead to "more joy and positive feelings for herself based on her own unique interests." Talked more about her feelings of anxiety, and interpersonal feelings she experiences. Discussed her positives and is to make a list as she wants to talk about this more next session.  Is close to her 32 yr old mother who remains active. Some flatness in affect which she was unaware of and denies any depression. Does smile  briefly at times later in conversation.  This is patient's second session with this therapist after completing the initial evaluation last session.  Wanting to continue working on her anxiety, lack of self-esteem, boundary issues, negativity with men in general, regaining more joy in her life, and letting go of baggage that she has carried from prior marriage over the years.  Originally, she had "depression" on that same list however reports today she is "not feeling depressed today nor in the past few days."  Interventions: Cognitive Behavioral Therapy and Ego-Supportive  Treatment goal plan of care: Patient not signing treatment goal plan on computer screen due to [place]. Treatment goals: Goals remain on treatment plan as patient works with strategies to achieve her goals in therapy.  Progress is assessed each session and noted in the "subjective" and/or "plan" sections of treatment note. Long-term goal: Stabilize anxiety level while increasing ability to have healthy boundaries, healthier self-esteem, less self doubt, and being able to set boundaries with who and what I am involved with, and letting go of old baggage. Short-term goal: Did not defy the major life conflicts for patient from the past and present that has led to her present anxiety and depression. Strategies: Develop behavioral and cognitive strategies to reduce and eliminate patient's irrational anxiety/depression.  Diagnosis:   ICD-10-CM   1. Generalized anxiety disorder  F41.1      Plan:  Patient today showing good motivation and participation  in session focusing on sharing more of herself, her sensitivities, and issues that she wants to pursue in therapy at this time.  Particularly interested in some of our conversation today as she wants to experience "more joy for herself now and in the years ahead".  Seems to be a bit guarded on some issues but does talk more readily today and opens up gradually about some of her concerns  regarding herself and/or family and relationships.  Did share more today about "self esteem being low and some negativity".  And also some history of "making bad choices and having poor boundaries" but not in all situations/relationships.  She is working on some written homework between sessions and we will see again within 2 weeks.  Encouraged patient in practicing more positive behaviors as noted in session including: Getting outside some each day, staying in touch with people who are supportive, looking for more positives versus negatives daily, remain on her prescribed medication, healthy nutrition and exercise, continue being more open to ways of being involved at her church in which she feels comfortable, refrain from assuming negatives and worst case scenarios, use positive self-talk, reduce overthinking and over analyzing, challenge and counteract self doubt, allow her faith to be an emotional support as well as spiritual, letting go of things including guilt from the past that can hold her back now from moving forward, and recognize the strength she shows working with goal directed behaviors to move in a direction that supports her increased self-confidence and improved emotional health.  Goal review and progress/challenges noted with patient.  Next appointment within 2 to 3 weeks.  This record has been created using [employer].  Chart creation errors have been sought, but may not always have been located and corrected.  Such creation errors do not reflect on the standard of medical care provided.   Shanon Ace, LCSW

## 2021-10-10 ENCOUNTER — Ambulatory Visit: Payer: PPO | Admitting: Psychiatry

## 2021-10-20 ENCOUNTER — Ambulatory Visit (INDEPENDENT_AMBULATORY_CARE_PROVIDER_SITE_OTHER): Payer: PPO | Admitting: Psychiatry

## 2021-10-20 DIAGNOSIS — F411 Generalized anxiety disorder: Secondary | ICD-10-CM

## 2021-10-20 NOTE — Progress Notes (Signed)
[facility] Counselor/Therapist Progress Note  Patient ID: Melody [name], MRN: [id number],    Date: 10/20/2021  Time Spent: 50 minutes   Treatment Type: Individual Therapy  Reported Symptoms: anxiety (improving), depression (improving)  Mental Status Exam:  Appearance:   Casual and Neat     Behavior:  Appropriate, Sharing, and Motivated  Motor:  Normal  Speech/Language:   Clear and Coherent  Affect:  anxiety  Mood:  anxious  Thought process:  goal directed  Thought content:    WNL  Sensory/Perceptual disturbances:    WNL  Orientation:  oriented to person, place, time/date, situation, day of week, month of year, year, and stated date of Oct. 12, 2023  Attention:  Good  Concentration:  Good  Memory:  WNL  Fund of knowledge:   Good  Insight:    Good  Judgment:   Good  Impulse Control:  Good   Risk Assessment: Danger to Self:  No Self-injurious Behavior: No Danger to Others: No Duty to Warn:no Physical Aggression / Violence:No  Access to Firearms a concern: No  Gang Involvement:No   Subjective: Patient in today having completed her therapy homework and finding it to be very helpful. Feeling more positive and recognizing her capabilities, understanding I don't have to accept everything and everybody. "I can say no when needed and have healthier boundaries." Need to say "no" more often and follow through with not settling "for less.Needing realistic expectations. Trying some positive journaling at beginning of day. Anxiety and depression are both "some better". Difficulty with "men" and relationships in past keeping me from being involved with men currently . "Want to change my ways of interacting in certain relationships" and discussed this in much more detail today in session.  Also processed her journaling with some input using specific examples for her.  Very motivated and follow-through on homework between sessions.  Anxiety decreasing gradually.  Depression decreasing  gradually.  Some increased smiling today in session.  Affect less flat today and stated at 1 point she feels that working on her issues here will help bring "me more joy and feeling better about myself and life".  Committed to working on her goal-directed behaviors as she works to decrease her anxiety, improve her self-esteem, strengthen her boundaries, improve her negativity with men in general, regain more joy in her life, and release a lot of baggage she has carried from prior marriage.  Interventions: Cognitive Behavioral Therapy and Ego-Supportive  Treatment goal plan of care: Patient not signing treatment goal plan on computer screen due to COVID. Treatment goals: Goals remain on treatment plan as patient works with strategies to achieve her goals in therapy.  Progress is assessed each session and noted in the "subjective" and/or "plan" sections of treatment note. Long-term goal: Stabilize anxiety level while increasing ability to have healthy boundaries, healthier self-esteem, less self doubt, and being able to set boundaries with who and what I am involved with, and letting go of old baggage. Short-term goal: Did not defy the major life conflicts for patient from the past and present that has led to her present anxiety and depression. Strategies: Develop behavioral and cognitive strategies to reduce and eliminate patient's irrational anxiety/depression.   Diagnosis:   ICD-10-CM   1. Generalized anxiety disorder  F41.1      Plan: Today showing good motivation and participation in session as she worked further on sharing her sensitivities and ways she knows that she has held herself back by holding onto  things in the past.  Worked well well in session on the homework that she did between sessions and realized that she has "for more positives that I was giving credit for to myself".  Looked at how she can use some of those positives/strengths as she works on her goal-directed behaviors and  therapy.  Not as guarded today.  More openly expressive.  No tearfulness.  Showing some increased self-esteem and less self negating.  Wants to also discuss some of her history of making bad choices and having poor boundaries and plans to talk about this more at next session. Encouraged patient in her practice of positive behaviors as noted in sessions including: Remaining in touch with people who are supportive to her, getting outside some each day and walking, looking for more positives versus negatives daily, stay on her prescribed medication, healthy nutrition and exercise, continue being more open to ways of being involved in her church in which she feels comfortable, refrain from assuming negatives and worst-case scenarios, use positive self talk, reduce overthinking and over analyzing, challenging counteract self-doubt, allow her face to be an emotional support, letting go of things including guilt from the past that can hold her back now for moving forward, and realize the strengths she shows working with goal-directed behaviors to move in a direction that supports her increased self-confidence, improved emotional health, and overall outlook.  Goal review and progress/challenges noted with patient.  Next appt within 2-3 weeks.  This record has been created using [employer].  Chart creation errors have been sought, but may not always have been located and corrected.  Such creation errors do not reflect on the standard of medical care provided.   Shanon Ace, LCSW

## 2021-11-02 ENCOUNTER — Ambulatory Visit: Payer: PPO | Admitting: Psychiatry

## 2021-11-03 ENCOUNTER — Ambulatory Visit (INDEPENDENT_AMBULATORY_CARE_PROVIDER_SITE_OTHER): Payer: PPO | Admitting: Psychiatry

## 2021-11-03 DIAGNOSIS — F411 Generalized anxiety disorder: Secondary | ICD-10-CM

## 2021-11-03 NOTE — Progress Notes (Signed)
[facility] Counselor/Therapist Progress Note  Patient ID: Melody [name], MRN: [id number],    Date: 11/03/2021  Time Spent: 50 minutes   Treatment Type: Individual Therapy ("much improved and more confident")  Mental Status Exam:  Appearance:   Casual     Behavior:  Appropriate, Sharing, and Motivated  Motor:  Normal  Speech/Language:   Clear and Coherent  Affect:  Appropriate  Mood:  appropriate  Thought process:  goal directed  Thought content:    WNL  Sensory/Perceptual disturbances:    WNL  Orientation:  oriented to person, place, time/date, situation, day of week, month of year, year, and stated date of Oct. 26, 2023  Attention:  Good  Concentration:  Good  Memory:  WNL  Fund of knowledge:   Good  Insight:    Good  Judgment:   Good  Impulse Control:  Good   Risk Assessment: Danger to Self:  No Self-injurious Behavior: No Danger to Others: No Duty to Warn:no Physical Aggression / Violence:No  Access to Firearms a concern: No  Gang Involvement:No   Subjective:  Patient in today reporting continued improvement as her anxiety has noticeably decreased. Feeling more optimistic, more positive in general. Has followed through on therapy suggestions, homework and use of certain strategies that she has found helpful, especially in using some listing relating to "her positives and realizing she does have a lot to offer, can manage her anxiety more effectively, and healthier ways of managing anxiety and stress. Involved herself in women's Bible study every other week. Recognizing more her value, and that others do want to have relationship with her. Learning to say "no" when she needs to and "having healthier boundaries with males and also females." Feels her "depressive issues are under control with medication and behavior changes that she has made." Negativity with "men in general" has lessened.  More actively involved with other people including taking a trip with 2 other  people to the mountains this past weekend.  Patient states she is "feeling more joy, feeling more confident and better overall about myself and my life, and having more resources now and helping him make good choices and decisions."  Has been very committed in therapy and working on her goal-directed behaviors and taking advantage of any and all resources that therapist shared with her.  Knows that she can contact our office at any time if the need arises.  Interventions: Cognitive Behavioral Therapy and Ego-Supportive  Treatment goal plan of care: Patient not signing treatment goal plan on computer screen due to COVID. Treatment goals: Goals remain on treatment plan as patient works with strategies to achieve her goals in therapy.  Progress is assessed each session and noted in the "subjective" and/or "plan" sections of treatment note. Long-term goal: Stabilize anxiety level while increasing ability to have healthy boundaries, healthier self-esteem, less self doubt, and being able to set boundaries with who and what I am involved with, and letting go of old baggage. Short-term goal: Did not defy the major life conflicts for patient from the past and present that has led to her present anxiety and depression. Strategies: Develop behavioral and cognitive strategies to reduce and eliminate patient's irrational anxiety/depression.  Diagnosis:   ICD-10-CM   1. Generalized anxiety disorder  F41.1      Plan:  Patient actively participating in showing good motivation in session as we reviewed the progress that patient has made which has been quite good.  Feels her choices have been  quite good more recently and that she can continue in this direction.  Openly expressive.  No tearfulness.  Not guarded.  Smiling appropriately and seems to feel more at ease.  She was very motivated upfront and participated well in therapy sessions, followed up on any and all homework and suggestions, worked well during  sessions staying focused on her treatment goals and encouraging herself and moving forward.  She shared some examples today of how she has made better choices, has isolated much less, and is experiencing more self-confidence.  We agreed that she did not need to reschedule right now and she understands that she can call to do so if the need arises.  Reviewed her progress and how she can continue to move in a forward direction by following some of the strategies that we discussed and she pursued and and outside of therapy that have been helpful to her. Encouraged patient to f continue ollowing through with positive behaviors as noted in session including: Remain on her prescribed medication, stay in touch with people who are supportive of her, get outside some each day and walk, look for more positives versus negatives daily, continue being more open to ways of being involved at her church in which she feels comfortable, refrain from assuming negatives and worst-case scenarios, use of positive self talk, healthy nutrition and exercise, reduce overthinking and over analyzing, challenge and counteract her self-doubt, allow her faith to be an emotional support, letting go of things including guilt from the past that can hold her back now from moving forward, and recognize the strength she shows working with goal-directed behaviors to move in a direction that supports her increased self-confidence and improved emotional health.  Goal review and progress/challenges noted with patient.  Not needing to return at this time.  Will call if needed.  This record has been created using [employer].  Chart creation errors have been sought, but may not always have been located and corrected.  Such creation errors do not reflect on the standard of medical care provided.   Shanon Ace, LCSW

## 2021-11-14 DIAGNOSIS — F3341 Major depressive disorder, recurrent, in partial remission: Secondary | ICD-10-CM | POA: Diagnosis not present

## 2021-11-14 DIAGNOSIS — E785 Hyperlipidemia, unspecified: Secondary | ICD-10-CM | POA: Diagnosis not present

## 2021-11-14 DIAGNOSIS — I10 Essential (primary) hypertension: Secondary | ICD-10-CM | POA: Diagnosis not present

## 2021-11-15 DIAGNOSIS — D2271 Melanocytic nevi of right lower limb, including hip: Secondary | ICD-10-CM | POA: Diagnosis not present

## 2021-11-15 DIAGNOSIS — D225 Melanocytic nevi of trunk: Secondary | ICD-10-CM | POA: Diagnosis not present

## 2021-11-15 DIAGNOSIS — D485 Neoplasm of uncertain behavior of skin: Secondary | ICD-10-CM | POA: Diagnosis not present

## 2021-11-15 DIAGNOSIS — Z86018 Personal history of other benign neoplasm: Secondary | ICD-10-CM | POA: Diagnosis not present

## 2021-11-15 DIAGNOSIS — L814 Other melanin hyperpigmentation: Secondary | ICD-10-CM | POA: Diagnosis not present

## 2021-11-15 DIAGNOSIS — L821 Other seborrheic keratosis: Secondary | ICD-10-CM | POA: Diagnosis not present

## 2021-11-15 DIAGNOSIS — L578 Other skin changes due to chronic exposure to nonionizing radiation: Secondary | ICD-10-CM | POA: Diagnosis not present

## 2021-11-15 DIAGNOSIS — L820 Inflamed seborrheic keratosis: Secondary | ICD-10-CM | POA: Diagnosis not present

## 2021-11-15 DIAGNOSIS — D2272 Melanocytic nevi of left lower limb, including hip: Secondary | ICD-10-CM | POA: Diagnosis not present

## 2021-11-28 DIAGNOSIS — Z23 Encounter for immunization: Secondary | ICD-10-CM | POA: Diagnosis not present

## 2021-11-29 DIAGNOSIS — D2271 Melanocytic nevi of right lower limb, including hip: Secondary | ICD-10-CM | POA: Diagnosis not present

## 2021-11-29 DIAGNOSIS — D239 Other benign neoplasm of skin, unspecified: Secondary | ICD-10-CM | POA: Diagnosis not present

## 2021-12-26 ENCOUNTER — Encounter: Payer: Self-pay | Admitting: Psychiatry

## 2021-12-26 ENCOUNTER — Ambulatory Visit: Payer: PPO | Admitting: Psychiatry

## 2021-12-26 DIAGNOSIS — F3342 Major depressive disorder, recurrent, in full remission: Secondary | ICD-10-CM | POA: Diagnosis not present

## 2021-12-26 DIAGNOSIS — F5105 Insomnia due to other mental disorder: Secondary | ICD-10-CM | POA: Diagnosis not present

## 2021-12-26 DIAGNOSIS — F411 Generalized anxiety disorder: Secondary | ICD-10-CM | POA: Diagnosis not present

## 2021-12-26 MED ORDER — DULOXETINE HCL 30 MG PO CPEP: ORAL_CAPSULE | ORAL | 1 refills | Status: DC

## 2021-12-26 MED ORDER — CLONAZEPAM 0.5 MG PO TABS: 0.5000 mg | ORAL_TABLET | Freq: Every day | ORAL | 1 refills | Status: DC

## 2021-12-26 MED ORDER — TRAZODONE HCL 100 MG PO TABS: ORAL_TABLET | ORAL | 3 refills | Status: DC
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
## 2022-01-20 ENCOUNTER — Other Ambulatory Visit: Payer: Self-pay | Admitting: Psychiatry

## 2022-01-20 DIAGNOSIS — F5105 Insomnia due to other mental disorder: Secondary | ICD-10-CM

## 2022-01-20 NOTE — Telephone Encounter (Signed)
Filled 10/24 appt 9/19

## 2022-03-20 ENCOUNTER — Telehealth: Payer: Self-pay | Admitting: Psychiatry

## 2022-03-20 NOTE — Telephone Encounter (Signed)
Hinda has been re-approved for pt assistance for Rexulti until 01/09/23.

## 2022-03-20 NOTE — Telephone Encounter (Signed)
She also gets Duloxetine with the pt assistance from [place]

## 2022-03-20 NOTE — Telephone Encounter (Signed)
Noted  

## 2022-05-01 DIAGNOSIS — Z1231 Encounter for screening mammogram for malignant neoplasm of breast: Secondary | ICD-10-CM | POA: Diagnosis not present

## 2022-06-22 DIAGNOSIS — E559 Vitamin D deficiency, unspecified: Secondary | ICD-10-CM | POA: Diagnosis not present

## 2022-06-22 DIAGNOSIS — M8588 Other specified disorders of bone density and structure, other site: Secondary | ICD-10-CM | POA: Diagnosis not present

## 2022-06-22 DIAGNOSIS — R159 Full incontinence of feces: Secondary | ICD-10-CM | POA: Diagnosis not present

## 2022-06-22 DIAGNOSIS — E785 Hyperlipidemia, unspecified: Secondary | ICD-10-CM | POA: Diagnosis not present

## 2022-06-22 DIAGNOSIS — Z0000 Encounter for general adult medical examination without abnormal findings: Secondary | ICD-10-CM | POA: Diagnosis not present

## 2022-06-22 DIAGNOSIS — L989 Disorder of the skin and subcutaneous tissue, unspecified: Secondary | ICD-10-CM | POA: Diagnosis not present

## 2022-06-22 DIAGNOSIS — F3341 Major depressive disorder, recurrent, in partial remission: Secondary | ICD-10-CM | POA: Diagnosis not present

## 2022-06-22 DIAGNOSIS — I10 Essential (primary) hypertension: Secondary | ICD-10-CM | POA: Diagnosis not present

## 2022-06-22 DIAGNOSIS — A609 Anogenital herpesviral infection, unspecified: Secondary | ICD-10-CM | POA: Diagnosis not present

## 2022-07-06 DIAGNOSIS — M8588 Other specified disorders of bone density and structure, other site: Secondary | ICD-10-CM | POA: Diagnosis not present

## 2022-07-16 ENCOUNTER — Other Ambulatory Visit: Payer: Self-pay | Admitting: Psychiatry

## 2022-07-16 DIAGNOSIS — F3342 Major depressive disorder, recurrent, in full remission: Secondary | ICD-10-CM

## 2022-07-25 ENCOUNTER — Other Ambulatory Visit: Payer: Self-pay | Admitting: Psychiatry

## 2022-07-25 DIAGNOSIS — F5105 Insomnia due to other mental disorder: Secondary | ICD-10-CM

## 2022-08-08 DIAGNOSIS — Z79899 Other long term (current) drug therapy: Secondary | ICD-10-CM | POA: Diagnosis not present

## 2022-08-08 DIAGNOSIS — E785 Hyperlipidemia, unspecified: Secondary | ICD-10-CM | POA: Diagnosis not present

## 2022-09-19 DIAGNOSIS — H2513 Age-related nuclear cataract, bilateral: Secondary | ICD-10-CM | POA: Diagnosis not present

## 2022-09-19 DIAGNOSIS — H04123 Dry eye syndrome of bilateral lacrimal glands: Secondary | ICD-10-CM | POA: Diagnosis not present

## 2022-09-19 DIAGNOSIS — H524 Presbyopia: Secondary | ICD-10-CM | POA: Diagnosis not present

## 2022-09-28 ENCOUNTER — Ambulatory Visit: Payer: PPO | Admitting: Psychiatry

## 2022-09-28 ENCOUNTER — Encounter: Payer: Self-pay | Admitting: Psychiatry

## 2022-09-28 DIAGNOSIS — F411 Generalized anxiety disorder: Secondary | ICD-10-CM

## 2022-09-28 DIAGNOSIS — F3342 Major depressive disorder, recurrent, in full remission: Secondary | ICD-10-CM

## 2022-09-28 DIAGNOSIS — F5105 Insomnia due to other mental disorder: Secondary | ICD-10-CM

## 2022-09-28 MED ORDER — TRAZODONE HCL 100 MG PO TABS: ORAL_TABLET | ORAL | 3 refills | Status: DC

## 2022-09-28 MED ORDER — CLONAZEPAM 0.5 MG PO TABS: 0.5000 mg | ORAL_TABLET | Freq: Every day | ORAL | 1 refills | Status: DC
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
## 2022-11-15 DIAGNOSIS — F3341 Major depressive disorder, recurrent, in partial remission: Secondary | ICD-10-CM | POA: Diagnosis not present

## 2022-11-15 DIAGNOSIS — E785 Hyperlipidemia, unspecified: Secondary | ICD-10-CM | POA: Diagnosis not present

## 2022-11-15 DIAGNOSIS — I10 Essential (primary) hypertension: Secondary | ICD-10-CM | POA: Diagnosis not present

## 2022-11-27 ENCOUNTER — Other Ambulatory Visit: Payer: Self-pay | Admitting: Psychiatry

## 2022-11-27 DIAGNOSIS — F3342 Major depressive disorder, recurrent, in full remission: Secondary | ICD-10-CM

## 2022-11-27 MED ORDER — DULOXETINE HCL 30 MG PO CPEP: ORAL_CAPSULE | ORAL | 3 refills | Status: DC

## 2022-12-05 DIAGNOSIS — D2272 Melanocytic nevi of left lower limb, including hip: Secondary | ICD-10-CM | POA: Diagnosis not present

## 2022-12-05 DIAGNOSIS — D2271 Melanocytic nevi of right lower limb, including hip: Secondary | ICD-10-CM | POA: Diagnosis not present

## 2022-12-05 DIAGNOSIS — D225 Melanocytic nevi of trunk: Secondary | ICD-10-CM | POA: Diagnosis not present

## 2022-12-05 DIAGNOSIS — L814 Other melanin hyperpigmentation: Secondary | ICD-10-CM | POA: Diagnosis not present

## 2022-12-05 DIAGNOSIS — Z86018 Personal history of other benign neoplasm: Secondary | ICD-10-CM | POA: Diagnosis not present

## 2022-12-05 DIAGNOSIS — L821 Other seborrheic keratosis: Secondary | ICD-10-CM | POA: Diagnosis not present

## 2022-12-05 DIAGNOSIS — L578 Other skin changes due to chronic exposure to nonionizing radiation: Secondary | ICD-10-CM | POA: Diagnosis not present

## 2023-01-11 ENCOUNTER — Telehealth: Payer: Self-pay | Admitting: Psychiatry

## 2023-01-11 NOTE — Telephone Encounter (Signed)
 Melody [name] has been re-approved for pt. Assistance for Rexulti, Cymbalta and some vitamins.  She approved through [id number].

## 2023-01-18 NOTE — Telephone Encounter (Signed)
 Noted thank you  No its just for documentation and when pt asks for refill sending correct location

## 2023-02-20 DIAGNOSIS — M9903 Segmental and somatic dysfunction of lumbar region: Secondary | ICD-10-CM | POA: Diagnosis not present

## 2023-02-20 DIAGNOSIS — M9902 Segmental and somatic dysfunction of thoracic region: Secondary | ICD-10-CM | POA: Diagnosis not present

## 2023-02-20 DIAGNOSIS — M62830 Muscle spasm of back: Secondary | ICD-10-CM | POA: Diagnosis not present

## 2023-02-20 DIAGNOSIS — M546 Pain in thoracic spine: Secondary | ICD-10-CM | POA: Diagnosis not present

## 2023-03-06 DIAGNOSIS — M9902 Segmental and somatic dysfunction of thoracic region: Secondary | ICD-10-CM | POA: Diagnosis not present

## 2023-03-06 DIAGNOSIS — M62830 Muscle spasm of back: Secondary | ICD-10-CM | POA: Diagnosis not present

## 2023-03-06 DIAGNOSIS — M546 Pain in thoracic spine: Secondary | ICD-10-CM | POA: Diagnosis not present

## 2023-03-06 DIAGNOSIS — M9903 Segmental and somatic dysfunction of lumbar region: Secondary | ICD-10-CM | POA: Diagnosis not present

## 2023-03-27 DIAGNOSIS — M546 Pain in thoracic spine: Secondary | ICD-10-CM | POA: Diagnosis not present

## 2023-03-27 DIAGNOSIS — M9902 Segmental and somatic dysfunction of thoracic region: Secondary | ICD-10-CM | POA: Diagnosis not present

## 2023-03-27 DIAGNOSIS — M62830 Muscle spasm of back: Secondary | ICD-10-CM | POA: Diagnosis not present

## 2023-03-27 DIAGNOSIS — M9903 Segmental and somatic dysfunction of lumbar region: Secondary | ICD-10-CM | POA: Diagnosis not present

## 2023-04-23 ENCOUNTER — Other Ambulatory Visit: Payer: Self-pay | Admitting: Psychiatry

## 2023-04-23 DIAGNOSIS — F5105 Insomnia due to other mental disorder: Secondary | ICD-10-CM

## 2023-04-23 NOTE — Telephone Encounter (Signed)
 01/26/2023 09/28/2022 2  Clonazepam 0.5 Mg Tablet 90.00 90 Ca Cot

## 2023-04-24 DIAGNOSIS — M9902 Segmental and somatic dysfunction of thoracic region: Secondary | ICD-10-CM | POA: Diagnosis not present

## 2023-04-24 DIAGNOSIS — M546 Pain in thoracic spine: Secondary | ICD-10-CM | POA: Diagnosis not present

## 2023-04-24 DIAGNOSIS — M9903 Segmental and somatic dysfunction of lumbar region: Secondary | ICD-10-CM | POA: Diagnosis not present

## 2023-04-24 DIAGNOSIS — M62830 Muscle spasm of back: Secondary | ICD-10-CM | POA: Diagnosis not present

## 2023-05-07 DIAGNOSIS — Z1231 Encounter for screening mammogram for malignant neoplasm of breast: Secondary | ICD-10-CM | POA: Diagnosis not present

## 2023-05-09 DIAGNOSIS — K5900 Constipation, unspecified: Secondary | ICD-10-CM | POA: Diagnosis not present

## 2023-05-09 DIAGNOSIS — R159 Full incontinence of feces: Secondary | ICD-10-CM | POA: Diagnosis not present

## 2023-05-09 DIAGNOSIS — Z860101 Personal history of adenomatous and serrated colon polyps: Secondary | ICD-10-CM | POA: Diagnosis not present

## 2023-05-15 DIAGNOSIS — N3941 Urge incontinence: Secondary | ICD-10-CM | POA: Diagnosis not present

## 2023-05-15 DIAGNOSIS — K5900 Constipation, unspecified: Secondary | ICD-10-CM | POA: Diagnosis not present

## 2023-05-15 DIAGNOSIS — M6281 Muscle weakness (generalized): Secondary | ICD-10-CM | POA: Diagnosis not present

## 2023-05-15 DIAGNOSIS — R159 Full incontinence of feces: Secondary | ICD-10-CM | POA: Diagnosis not present

## 2023-05-22 ENCOUNTER — Other Ambulatory Visit: Payer: Self-pay

## 2023-05-22 DIAGNOSIS — M9902 Segmental and somatic dysfunction of thoracic region: Secondary | ICD-10-CM | POA: Diagnosis not present

## 2023-05-22 DIAGNOSIS — M9903 Segmental and somatic dysfunction of lumbar region: Secondary | ICD-10-CM | POA: Diagnosis not present

## 2023-05-22 DIAGNOSIS — M62830 Muscle spasm of back: Secondary | ICD-10-CM | POA: Diagnosis not present

## 2023-05-22 DIAGNOSIS — M546 Pain in thoracic spine: Secondary | ICD-10-CM | POA: Diagnosis not present

## 2023-05-29 ENCOUNTER — Other Ambulatory Visit: Payer: Self-pay

## 2023-05-29 ENCOUNTER — Other Ambulatory Visit: Payer: Self-pay | Admitting: Gastroenterology

## 2023-05-29 DIAGNOSIS — R350 Frequency of micturition: Secondary | ICD-10-CM | POA: Diagnosis not present

## 2023-05-29 DIAGNOSIS — N3941 Urge incontinence: Secondary | ICD-10-CM | POA: Diagnosis not present

## 2023-05-29 DIAGNOSIS — K5900 Constipation, unspecified: Secondary | ICD-10-CM | POA: Diagnosis not present

## 2023-05-29 DIAGNOSIS — R159 Full incontinence of feces: Secondary | ICD-10-CM | POA: Diagnosis not present

## 2023-05-29 DIAGNOSIS — M6281 Muscle weakness (generalized): Secondary | ICD-10-CM | POA: Diagnosis not present

## 2023-06-09 DIAGNOSIS — I10 Essential (primary) hypertension: Secondary | ICD-10-CM | POA: Diagnosis not present

## 2023-06-09 DIAGNOSIS — E785 Hyperlipidemia, unspecified: Secondary | ICD-10-CM | POA: Diagnosis not present

## 2023-06-09 DIAGNOSIS — F3341 Major depressive disorder, recurrent, in partial remission: Secondary | ICD-10-CM | POA: Diagnosis not present

## 2023-06-19 DIAGNOSIS — M9903 Segmental and somatic dysfunction of lumbar region: Secondary | ICD-10-CM | POA: Diagnosis not present

## 2023-06-19 DIAGNOSIS — M546 Pain in thoracic spine: Secondary | ICD-10-CM | POA: Diagnosis not present

## 2023-06-19 DIAGNOSIS — M62830 Muscle spasm of back: Secondary | ICD-10-CM | POA: Diagnosis not present

## 2023-06-19 DIAGNOSIS — M9902 Segmental and somatic dysfunction of thoracic region: Secondary | ICD-10-CM | POA: Diagnosis not present

## 2023-07-09 DIAGNOSIS — E785 Hyperlipidemia, unspecified: Secondary | ICD-10-CM | POA: Diagnosis not present

## 2023-07-09 DIAGNOSIS — F3341 Major depressive disorder, recurrent, in partial remission: Secondary | ICD-10-CM | POA: Diagnosis not present

## 2023-07-09 DIAGNOSIS — I10 Essential (primary) hypertension: Secondary | ICD-10-CM | POA: Diagnosis not present

## 2023-07-16 DIAGNOSIS — M6281 Muscle weakness (generalized): Secondary | ICD-10-CM | POA: Diagnosis not present

## 2023-07-16 DIAGNOSIS — R159 Full incontinence of feces: Secondary | ICD-10-CM | POA: Diagnosis not present

## 2023-07-16 DIAGNOSIS — K5900 Constipation, unspecified: Secondary | ICD-10-CM | POA: Diagnosis not present

## 2023-07-16 DIAGNOSIS — N3941 Urge incontinence: Secondary | ICD-10-CM | POA: Diagnosis not present

## 2023-07-19 DIAGNOSIS — E785 Hyperlipidemia, unspecified: Secondary | ICD-10-CM | POA: Diagnosis not present

## 2023-07-19 DIAGNOSIS — F3341 Major depressive disorder, recurrent, in partial remission: Secondary | ICD-10-CM | POA: Diagnosis not present

## 2023-07-19 DIAGNOSIS — E559 Vitamin D deficiency, unspecified: Secondary | ICD-10-CM | POA: Diagnosis not present

## 2023-07-19 DIAGNOSIS — A609 Anogenital herpesviral infection, unspecified: Secondary | ICD-10-CM | POA: Diagnosis not present

## 2023-07-19 DIAGNOSIS — M8588 Other specified disorders of bone density and structure, other site: Secondary | ICD-10-CM | POA: Diagnosis not present

## 2023-07-19 DIAGNOSIS — R159 Full incontinence of feces: Secondary | ICD-10-CM | POA: Diagnosis not present

## 2023-07-19 DIAGNOSIS — Z0000 Encounter for general adult medical examination without abnormal findings: Secondary | ICD-10-CM | POA: Diagnosis not present

## 2023-07-19 DIAGNOSIS — I10 Essential (primary) hypertension: Secondary | ICD-10-CM | POA: Diagnosis not present

## 2023-07-24 DIAGNOSIS — M9903 Segmental and somatic dysfunction of lumbar region: Secondary | ICD-10-CM | POA: Diagnosis not present

## 2023-07-24 DIAGNOSIS — M9902 Segmental and somatic dysfunction of thoracic region: Secondary | ICD-10-CM | POA: Diagnosis not present

## 2023-07-24 DIAGNOSIS — M546 Pain in thoracic spine: Secondary | ICD-10-CM | POA: Diagnosis not present

## 2023-07-24 DIAGNOSIS — M62830 Muscle spasm of back: Secondary | ICD-10-CM | POA: Diagnosis not present

## 2023-07-30 DIAGNOSIS — M6281 Muscle weakness (generalized): Secondary | ICD-10-CM | POA: Diagnosis not present

## 2023-07-30 DIAGNOSIS — R159 Full incontinence of feces: Secondary | ICD-10-CM | POA: Diagnosis not present

## 2023-07-30 DIAGNOSIS — R350 Frequency of micturition: Secondary | ICD-10-CM | POA: Diagnosis not present

## 2023-07-30 DIAGNOSIS — N3941 Urge incontinence: Secondary | ICD-10-CM | POA: Diagnosis not present

## 2023-08-09 DIAGNOSIS — I10 Essential (primary) hypertension: Secondary | ICD-10-CM | POA: Diagnosis not present

## 2023-08-09 DIAGNOSIS — E785 Hyperlipidemia, unspecified: Secondary | ICD-10-CM | POA: Diagnosis not present

## 2023-08-09 DIAGNOSIS — F3341 Major depressive disorder, recurrent, in partial remission: Secondary | ICD-10-CM | POA: Diagnosis not present

## 2023-08-21 DIAGNOSIS — M62830 Muscle spasm of back: Secondary | ICD-10-CM | POA: Diagnosis not present

## 2023-08-21 DIAGNOSIS — M546 Pain in thoracic spine: Secondary | ICD-10-CM | POA: Diagnosis not present

## 2023-08-21 DIAGNOSIS — M9903 Segmental and somatic dysfunction of lumbar region: Secondary | ICD-10-CM | POA: Diagnosis not present

## 2023-08-21 DIAGNOSIS — M9902 Segmental and somatic dysfunction of thoracic region: Secondary | ICD-10-CM | POA: Diagnosis not present

## 2023-09-04 DIAGNOSIS — Z9049 Acquired absence of other specified parts of digestive tract: Secondary | ICD-10-CM | POA: Diagnosis not present

## 2023-09-04 DIAGNOSIS — R159 Full incontinence of feces: Secondary | ICD-10-CM | POA: Diagnosis not present

## 2023-09-04 DIAGNOSIS — Z860101 Personal history of adenomatous and serrated colon polyps: Secondary | ICD-10-CM | POA: Diagnosis not present

## 2023-09-09 DIAGNOSIS — E785 Hyperlipidemia, unspecified: Secondary | ICD-10-CM | POA: Diagnosis not present

## 2023-09-09 DIAGNOSIS — I10 Essential (primary) hypertension: Secondary | ICD-10-CM | POA: Diagnosis not present

## 2023-09-09 DIAGNOSIS — F3341 Major depressive disorder, recurrent, in partial remission: Secondary | ICD-10-CM | POA: Diagnosis not present

## 2023-09-11 DIAGNOSIS — K648 Other hemorrhoids: Secondary | ICD-10-CM | POA: Diagnosis not present

## 2023-09-11 DIAGNOSIS — K5730 Diverticulosis of large intestine without perforation or abscess without bleeding: Secondary | ICD-10-CM | POA: Diagnosis not present

## 2023-09-11 DIAGNOSIS — R194 Change in bowel habit: Secondary | ICD-10-CM | POA: Diagnosis not present

## 2023-09-11 DIAGNOSIS — D120 Benign neoplasm of cecum: Secondary | ICD-10-CM | POA: Diagnosis not present

## 2023-09-11 DIAGNOSIS — D124 Benign neoplasm of descending colon: Secondary | ICD-10-CM | POA: Diagnosis not present

## 2023-09-13 DIAGNOSIS — D120 Benign neoplasm of cecum: Secondary | ICD-10-CM | POA: Diagnosis not present

## 2023-09-13 DIAGNOSIS — D124 Benign neoplasm of descending colon: Secondary | ICD-10-CM | POA: Diagnosis not present

## 2023-10-01 ENCOUNTER — Ambulatory Visit: Payer: PPO | Admitting: Psychiatry

## 2023-10-01 ENCOUNTER — Encounter: Payer: Self-pay | Admitting: Psychiatry

## 2023-10-01 DIAGNOSIS — F411 Generalized anxiety disorder: Secondary | ICD-10-CM

## 2023-10-01 DIAGNOSIS — F5105 Insomnia due to other mental disorder: Secondary | ICD-10-CM

## 2023-10-01 DIAGNOSIS — F3342 Major depressive disorder, recurrent, in full remission: Secondary | ICD-10-CM

## 2023-10-01 DIAGNOSIS — F331 Major depressive disorder, recurrent, moderate: Secondary | ICD-10-CM | POA: Diagnosis not present

## 2023-10-01 MED ORDER — DULOXETINE HCL 30 MG PO CPEP: ORAL_CAPSULE | ORAL | 3 refills | Status: AC

## 2023-10-01 MED ORDER — CLONAZEPAM 0.5 MG PO TABS
0.5000 mg | ORAL_TABLET | Freq: Every day | ORAL | 1 refills | Status: AC
Start: 2023-10-01 — End: ?

## 2023-10-01 MED ORDER — TRAZODONE HCL 100 MG PO TABS: ORAL_TABLET | ORAL | 3 refills | Status: AC
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
## 2023-10-02 DIAGNOSIS — H04123 Dry eye syndrome of bilateral lacrimal glands: Secondary | ICD-10-CM | POA: Diagnosis not present

## 2023-10-02 DIAGNOSIS — R159 Full incontinence of feces: Secondary | ICD-10-CM | POA: Diagnosis not present

## 2023-10-02 DIAGNOSIS — M6281 Muscle weakness (generalized): Secondary | ICD-10-CM | POA: Diagnosis not present

## 2023-10-02 DIAGNOSIS — H2513 Age-related nuclear cataract, bilateral: Secondary | ICD-10-CM | POA: Diagnosis not present

## 2023-10-02 DIAGNOSIS — H524 Presbyopia: Secondary | ICD-10-CM | POA: Diagnosis not present

## 2023-10-03 ENCOUNTER — Telehealth: Payer: Self-pay | Admitting: Psychiatry

## 2023-10-03 NOTE — Telephone Encounter (Signed)
 After speaking with family pt wants to get off Rexulti  and try a different medication.SABRASABRAMaybe one Dr Geoffry mentioned in last visit. Please RTC

## 2023-10-04 NOTE — Telephone Encounter (Signed)
 She did have SE blunting on Abilify .  Took Rexulti  a long time with no apparent px but is now complaining of being blunted.  It is probably rexulti  but to be sure, stop it now.  It will take weeks to get out of her system, about 6 .  Have her call us  in 6 weeks and let us  know how things are off the med.  Don't start something new until this SE resolves off the Rexulti .    Sched appt in Early Dec to fu whatever new thing we start in 6 weeks.     Lorene Macintosh, MD, DFAPA

## 2023-10-04 NOTE — Telephone Encounter (Signed)
 Pt seen on 9/22 and due to feeling flat on Rexulti  she said other medications were discussed. Pt said she has had 2 recent family events and it has been mentioned by family members that she was devoid of emotion. Her brother questioned if she had had a stroke. She said her daughter has mentioned this to her before but she passed it off. She would like to try another medication.   [facility]

## 2023-10-05 NOTE — Telephone Encounter (Signed)
 Patient notified of recommendations.

## 2023-10-09 DIAGNOSIS — F3341 Major depressive disorder, recurrent, in partial remission: Secondary | ICD-10-CM | POA: Diagnosis not present

## 2023-10-09 DIAGNOSIS — E785 Hyperlipidemia, unspecified: Secondary | ICD-10-CM | POA: Diagnosis not present

## 2023-10-09 DIAGNOSIS — I10 Essential (primary) hypertension: Secondary | ICD-10-CM | POA: Diagnosis not present

## 2023-10-10 ENCOUNTER — Encounter (HOSPITAL_COMMUNITY): Payer: Self-pay | Admitting: Gastroenterology

## 2023-10-10 ENCOUNTER — Ambulatory Visit (HOSPITAL_COMMUNITY)
Admission: RE | Admit: 2023-10-10 | Discharge: 2023-10-10 | Disposition: A | Discharge status: Home / Self Care | Attending: Gastroenterology | Admitting: Gastroenterology

## 2023-10-10 ENCOUNTER — Encounter (HOSPITAL_COMMUNITY)
Admission: RE | Disposition: A | Discharge status: Home / Self Care | Payer: Self-pay | Source: Home / Self Care | Attending: Gastroenterology

## 2023-10-10 DIAGNOSIS — R159 Full incontinence of feces: Secondary | ICD-10-CM | POA: Insufficient documentation

## 2023-10-10 HISTORY — PX: ANAL RECTAL MANOMETRY: SHX6358

## 2023-10-10 SURGERY — MANOMETRY, ANORECTAL

## 2023-10-10 NOTE — Progress Notes (Signed)
 Anal rectal manometry performed per protocol without complications.  Patient tolerated well.  Balloon expulsion test performed per protocol without complications.  Patient tolerated well.  Expelled balloon after 8 seconds.

## 2023-10-11 ENCOUNTER — Encounter (HOSPITAL_COMMUNITY): Payer: Self-pay | Admitting: Gastroenterology

## 2023-10-12 DIAGNOSIS — R159 Full incontinence of feces: Secondary | ICD-10-CM | POA: Diagnosis not present

## 2023-10-16 DIAGNOSIS — R159 Full incontinence of feces: Secondary | ICD-10-CM | POA: Diagnosis not present

## 2023-11-09 DIAGNOSIS — I10 Essential (primary) hypertension: Secondary | ICD-10-CM | POA: Diagnosis not present

## 2023-11-09 DIAGNOSIS — E785 Hyperlipidemia, unspecified: Secondary | ICD-10-CM | POA: Diagnosis not present

## 2023-11-09 DIAGNOSIS — F3341 Major depressive disorder, recurrent, in partial remission: Secondary | ICD-10-CM | POA: Diagnosis not present

## 2023-11-20 DIAGNOSIS — M9904 Segmental and somatic dysfunction of sacral region: Secondary | ICD-10-CM | POA: Diagnosis not present

## 2023-11-20 DIAGNOSIS — M9902 Segmental and somatic dysfunction of thoracic region: Secondary | ICD-10-CM | POA: Diagnosis not present

## 2023-11-20 DIAGNOSIS — M9903 Segmental and somatic dysfunction of lumbar region: Secondary | ICD-10-CM | POA: Diagnosis not present

## 2023-11-20 DIAGNOSIS — M9905 Segmental and somatic dysfunction of pelvic region: Secondary | ICD-10-CM | POA: Diagnosis not present

## 2023-11-20 DIAGNOSIS — M9901 Segmental and somatic dysfunction of cervical region: Secondary | ICD-10-CM | POA: Diagnosis not present

## 2023-11-20 DIAGNOSIS — M9907 Segmental and somatic dysfunction of upper extremity: Secondary | ICD-10-CM | POA: Diagnosis not present

## 2023-12-04 DIAGNOSIS — Z9049 Acquired absence of other specified parts of digestive tract: Secondary | ICD-10-CM | POA: Diagnosis not present

## 2023-12-04 DIAGNOSIS — R159 Full incontinence of feces: Secondary | ICD-10-CM | POA: Diagnosis not present

## 2023-12-09 DIAGNOSIS — I10 Essential (primary) hypertension: Secondary | ICD-10-CM | POA: Diagnosis not present

## 2023-12-09 DIAGNOSIS — E785 Hyperlipidemia, unspecified: Secondary | ICD-10-CM | POA: Diagnosis not present

## 2023-12-09 DIAGNOSIS — F3341 Major depressive disorder, recurrent, in partial remission: Secondary | ICD-10-CM | POA: Diagnosis not present

## 2023-12-14 DIAGNOSIS — E785 Hyperlipidemia, unspecified: Secondary | ICD-10-CM | POA: Diagnosis not present

## 2023-12-14 DIAGNOSIS — F3341 Major depressive disorder, recurrent, in partial remission: Secondary | ICD-10-CM | POA: Diagnosis not present

## 2023-12-14 DIAGNOSIS — I10 Essential (primary) hypertension: Secondary | ICD-10-CM | POA: Diagnosis not present

## 2023-12-17 ENCOUNTER — Ambulatory Visit: Admitting: Psychiatry

## 2024-01-31 ENCOUNTER — Other Ambulatory Visit: Payer: Self-pay | Admitting: Family Medicine

## 2024-01-31 DIAGNOSIS — K1379 Other lesions of oral mucosa: Secondary | ICD-10-CM

## 2024-02-11 ENCOUNTER — Inpatient Hospital Stay: Admission: RE | Admit: 2024-02-11 | Source: Ambulatory Visit

## 2024-02-12 ENCOUNTER — Ambulatory Visit
Admission: RE | Admit: 2024-02-12 | Discharge: 2024-02-12 | Disposition: A | Source: Ambulatory Visit | Attending: Family Medicine | Admitting: Family Medicine

## 2024-02-12 DIAGNOSIS — K1379 Other lesions of oral mucosa: Secondary | ICD-10-CM

## 2024-02-12 MED ORDER — IOPAMIDOL (ISOVUE-300) INJECTION 61%
75.0000 mL | Freq: Once | INTRAVENOUS | Status: AC | PRN
  Administered 2024-02-12: 75 mL via INTRAVENOUS

## 2024-09-30 ENCOUNTER — Ambulatory Visit: Admitting: Psychiatry
